# Patient Record
Sex: Female | Born: 1990 | Race: White | Hispanic: No | Marital: Married | Smoking: Never smoker
Health system: Southern US, Community
[De-identification: ages and names within clinical notes are randomized; demographics above are authoritative.]

## PROBLEM LIST (undated history)

## (undated) DIAGNOSIS — F329 Major depressive disorder, single episode, unspecified: Secondary | ICD-10-CM

## (undated) DIAGNOSIS — F419 Anxiety disorder, unspecified: Secondary | ICD-10-CM

## (undated) DIAGNOSIS — F32A Depression, unspecified: Secondary | ICD-10-CM

## (undated) HISTORY — PX: WISDOM TOOTH EXTRACTION: SHX21

## (undated) HISTORY — PX: TONSILLECTOMY: SUR1361

## (undated) HISTORY — DX: Anxiety disorder, unspecified: F41.9

## (undated) HISTORY — DX: Major depressive disorder, single episode, unspecified: F32.9

## (undated) HISTORY — DX: Depression, unspecified: F32.A

## (undated) NOTE — Procedures (Signed)
 Formatting of this note might be different from the original.   Patient: Sapir Lavey [facility] MEDICAL RECORD [id number]   DOB: 1991/08/12  Procedure: High-resolution esophageal manometry with impedance  Date of Procedure: 01/11/2023  Indications:  Dysphagia  Procedure Details  Per nurse's protocol, a topical analgesic was used to numb the nares followed by trans-nasal insertion of the esophageal manometry catheter.  The patient was encouraged to relax while acclimating to the catheter for approximately 5 minutes.  The lower and upper esophageal sphincters were observed using manometric technique.  A series of ten wet swallows using 5 cc room temperature water was performed to assess esophageal motility.  Measurements were obtained of the lower esophageal sphincter, esophageal body, and upper esophageal sphincter.    Findings: Anatomy: The proximal margin of the LES was identified using manometric technique and found to be at 39.5 cm from the nares.  The LES length was 2.7 cm (nl 2.7-4.8 cm).  A hiatal hernia was not seen.  Lower esophageal sphincter:   The mean basal pressure was 11.8 which is abnormal (nl 13-43 mmHg).  The mean residual pressure was 6.1 which is normal  (nl <15 mmHg); this is consistent with adequate relaxation of the LES.    Esophageal body:   10 wet swallows were evaluated.  The mean distal contractile integral (DCI, measure of contractile vigor) was 2067 which is normal  (nl [phone] mmHg-cm-s).  The mean distal latency was 5.4 which is normal (nl >4.5 sec, with <4.5 sec suggesting premature contractions or spasm).    When evaluating the individual swallows, there were no failed or weak swallows; each individual swallow demonstrated contractile vigor within normal range.   There were no swallows with premature contractions or containing large breaks.  No other abnormalities on individual swallows are noted based on current [place] Classification.  Impedance analysis  demonstrates complete bolus clearance in 100% of swallows.    Upper esophageal sphincter:   The mean basal pressure was 131, which is abnormal (nl 34-104 mmHg).  The mean residual pressure was -6.4 which is normal (nl <12.0 mmHg).  Impression:   Slightly low basal pressure of LES of unclear significance.  The UES pressure is abnormally high, which is of unclear clinical significance. Otherwise normal manometery  Recommendations: Follow up with Dr. Christiana Chew K. [place], DO [facility] 01/24/2023, 16:07 Electronically signed by Chew Drivers Matrachisia, DO at 01/24/2023  4:12 PM EST

---
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
## 2010-03-14 ENCOUNTER — Emergency Department (HOSPITAL_COMMUNITY): Admission: EM | Admit: 2010-03-14 | Discharge: 2010-03-15 | Payer: Self-pay | Admitting: Emergency Medicine

## 2015-06-29 ENCOUNTER — Encounter (HOSPITAL_COMMUNITY): Payer: Self-pay | Admitting: Neurology

## 2015-06-29 ENCOUNTER — Emergency Department (HOSPITAL_COMMUNITY): Payer: 59

## 2015-06-29 ENCOUNTER — Emergency Department (HOSPITAL_COMMUNITY)
Admission: EM | Admit: 2015-06-29 | Discharge: 2015-06-29 | Disposition: A | Discharge status: Home / Self Care | Payer: 59 | Attending: Emergency Medicine | Admitting: Emergency Medicine

## 2015-06-29 DIAGNOSIS — F410 Panic disorder [episodic paroxysmal anxiety] without agoraphobia: Secondary | ICD-10-CM | POA: Insufficient documentation

## 2015-06-29 DIAGNOSIS — R202 Paresthesia of skin: Secondary | ICD-10-CM | POA: Diagnosis not present

## 2015-06-29 DIAGNOSIS — R42 Dizziness and giddiness: Secondary | ICD-10-CM

## 2015-06-29 DIAGNOSIS — E876 Hypokalemia: Secondary | ICD-10-CM | POA: Insufficient documentation

## 2015-06-29 DIAGNOSIS — Z3202 Encounter for pregnancy test, result negative: Secondary | ICD-10-CM | POA: Diagnosis not present

## 2015-06-29 DIAGNOSIS — R000 Tachycardia, unspecified: Secondary | ICD-10-CM | POA: Insufficient documentation

## 2015-06-29 DIAGNOSIS — R0789 Other chest pain: Secondary | ICD-10-CM | POA: Diagnosis not present

## 2015-06-29 DIAGNOSIS — R110 Nausea: Secondary | ICD-10-CM

## 2015-06-29 LAB — I-STAT BETA HCG BLOOD, ED (MC, WL, AP ONLY): I-stat hCG, quantitative: <5 m[IU]/mL (ref <5)

## 2015-06-29 LAB — CBC WITH DIFFERENTIAL/PLATELET
Basophils Absolute: 0.1 10*3/uL (ref 0.0–0.1)
Basophils Relative: 1 % (ref 0–1)
EOS PCT: 4 % (ref 0–5)
Eosinophils Absolute: 0.3 10*3/uL (ref 0.0–0.7)
HCT: 40.2 % (ref 36.0–46.0)
HEMOGLOBIN: 13.6 g/dL (ref 12.0–15.0)
Lymphocytes Relative: 47 % — ABNORMAL HIGH (ref 12–46)
Lymphs Abs: 3.2 10*3/uL (ref 0.7–4.0)
MCH: 29.3 pg (ref 26.0–34.0)
MCHC: 33.8 g/dL (ref 30.0–36.0)
MCV: 86.6 fL (ref 78.0–100.0)
Monocytes Absolute: 0.6 10*3/uL (ref 0.1–1.0)
Monocytes Relative: 8 % (ref 3–12)
NEUTROS ABS: 2.7 10*3/uL (ref 1.7–7.7)
NEUTROS PCT: 40 % — AB (ref 43–77)
Platelets: 330 10*3/uL (ref 150–400)
RBC: 4.64 MIL/uL (ref 3.87–5.11)
RDW: 13.7 % (ref 11.5–15.5)
WBC: 6.9 10*3/uL (ref 4.0–10.5)

## 2015-06-29 LAB — COMPREHENSIVE METABOLIC PANEL
ALT: 18 U/L (ref 14–54)
ANION GAP: 12 (ref 5–15)
AST: 27 U/L (ref 15–41)
Albumin: 4.1 g/dL (ref 3.5–5.0)
Alkaline Phosphatase: 38 U/L (ref 38–126)
BUN: 9 mg/dL (ref 6–20)
CO2: 20 mmol/L — AB (ref 22–32)
Calcium: 9.1 mg/dL (ref 8.9–10.3)
Chloride: 102 mmol/L (ref 101–111)
Creatinine, Ser: 0.77 mg/dL (ref 0.44–1.00)
GFR calc Af Amer: >60 mL/min (ref >60)
GFR calc non Af Amer: >60 mL/min (ref >60)
GLUCOSE: 90 mg/dL (ref 65–99)
POTASSIUM: 3.1 mmol/L — AB (ref 3.5–5.1)
Sodium: 134 mmol/L — ABNORMAL LOW (ref 135–145)
Total Bilirubin: 0.9 mg/dL (ref 0.3–1.2)
Total Protein: 7.1 g/dL (ref 6.5–8.1)

## 2015-06-29 LAB — MAGNESIUM: Magnesium: 1.6 mg/dL — ABNORMAL LOW (ref 1.7–2.4)

## 2015-06-29 LAB — I-STAT TROPONIN, ED: Troponin i, poc: 0 ng/mL (ref 0.00–0.08)

## 2015-06-29 LAB — PHOSPHORUS: Phosphorus: 1 mg/dL — CL (ref 2.5–4.6)

## 2015-06-29 LAB — CBG MONITORING, ED: GLUCOSE-CAPILLARY: 85 mg/dL (ref 65–99)

## 2015-06-29 MED ORDER — LORAZEPAM 2 MG/ML IJ SOLN
0.5000 mg | Freq: Once | INTRAMUSCULAR | Status: AC
  Administered 2015-06-29: 0.5 mg via INTRAVENOUS
  Filled 2015-06-29: qty 1

## 2015-06-29 MED ORDER — NITROGLYCERIN 0.4 MG SL SUBL: 0.4000 mg | SUBLINGUAL_TABLET | SUBLINGUAL | Status: DC | PRN

## 2015-06-29 MED ORDER — ASPIRIN 81 MG PO CHEW
324.0000 mg | CHEWABLE_TABLET | Freq: Once | ORAL | Status: AC
Start: 2015-06-29 — End: 2015-06-29
  Administered 2015-06-29: 324 mg via ORAL
  Filled 2015-06-29: qty 4

## 2015-06-29 MED ORDER — POTASSIUM PHOSPHATE MONOBASIC 500 MG PO TABS
500.0000 mg | ORAL_TABLET | Freq: Once | ORAL | Status: DC
  Filled 2015-06-29: qty 1

## 2015-06-29 MED ORDER — POTASSIUM CHLORIDE CRYS ER 20 MEQ PO TBCR: 60.0000 meq | EXTENDED_RELEASE_TABLET | Freq: Once | ORAL | Status: DC

## 2015-06-29 MED ORDER — ONDANSETRON HCL 4 MG/2ML IJ SOLN
4.0000 mg | Freq: Once | INTRAMUSCULAR | Status: AC
  Administered 2015-06-29: 4 mg via INTRAVENOUS
  Filled 2015-06-29: qty 2

## 2015-06-29 MED ORDER — K PHOS MONO-SOD PHOS DI & MONO 155-852-130 MG PO TABS
500.0000 mg | ORAL_TABLET | Freq: Once | ORAL | Status: AC
  Administered 2015-06-29: 500 mg via ORAL
  Filled 2015-06-29: qty 2

## 2015-06-29 MED ORDER — POTASSIUM PHOSPHATE MONOBASIC 500 MG PO TABS: 500.0000 mg | ORAL_TABLET | Freq: Three times a day (TID) | ORAL | Status: DC

## 2015-06-29 MED ORDER — SODIUM CHLORIDE 0.9 % IV BOLUS (SEPSIS)
1000.0000 mL | Freq: Once | INTRAVENOUS | Status: AC
  Administered 2015-06-29: 1000 mL via INTRAVENOUS
  Filled 2015-06-29: qty 1000

## 2015-06-29 NOTE — Discharge Instructions (Signed)
Your symptoms today were likely related to a panic attack, and some dehydration. Your potassium and phosphorus were low, you will need to start taking the supplement prescribed to you to replenish this. Use the list of foods below to find out what foods may help supplement your diet to avoid further issues with low electrolytes. Stay very well hydrated. Avoid stimulant medications and foods, such as coffee, tea, or your diet pill in order to avoid worsening symptoms. Follow up with your regular doctor in 5-7 days for recheck of symptoms and ongoing evaluation. Return to the ER for changes or worsening symptoms.   Chest Pain (Nonspecific) It is often hard to give a diagnosis for the cause of chest pain. There is always a chance that your pain could be related to something serious, such as a heart attack or a blood clot in the lungs. You need to follow up with your doctor. HOME CARE  If antibiotic medicine was given, take it as directed by your doctor. Finish the medicine even if you start to feel better.  For the next few days, avoid activities that bring on chest pain. Continue physical activities as told by your doctor.  Do not use any tobacco products. This includes cigarettes, chewing tobacco, and e-cigarettes.  Avoid drinking alcohol.  Only take medicine as told by your doctor.  Follow your doctor's suggestions for more testing if your chest pain does not go away.  Keep all doctor visits you made. GET HELP IF:  Your chest pain does not go away, even after treatment.  You have a rash with blisters on your chest.  You have a fever. GET HELP RIGHT AWAY IF:   You have more pain or pain that spreads to your arm, neck, jaw, back, or belly (abdomen).  You have shortness of breath.  You cough more than usual or cough up blood.  You have very bad back or belly pain.  You feel sick to your stomach (nauseous) or throw up (vomit).  You have very bad weakness.  You pass out  (faint).  You have chills. This is an emergency. Do not wait to see if the problems will go away. Call your local emergency services (911 in [place] Do not drive yourself to the hospital. MAKE SURE YOU:   Understand these instructions.  Will watch your condition.  Will get help right away if you are not doing well or get worse. Document Released: 05/15/2008 Document Revised: 12/02/2013 Document Reviewed: 05/15/2008 [facility] Patient Information 2015 [place], [place]. This information is not intended to replace advice given to you by your health care provider. Make sure you discuss any questions you have with your health care provider.  Hypokalemia Hypokalemia means that the amount of potassium in the blood is lower than normal.Potassium is a chemical, called an electrolyte, that helps regulate the amount of fluid in the body. It also stimulates muscle contraction and helps nerves function properly.Most of the body's potassium is inside of cells, and only a very small amount is in the blood. Because the amount in the blood is so small, minor changes can be life-threatening. CAUSES  Antibiotics.  Diarrhea or vomiting.  Using laxatives too much, which can cause diarrhea.  Chronic kidney disease.  Water pills (diuretics).  Eating disorders (bulimia).  Low magnesium level.  Sweating a lot. SIGNS AND SYMPTOMS  Weakness.  Constipation.  Fatigue.  Muscle cramps.  Mental confusion.  Skipped heartbeats or irregular heartbeat (palpitations).  Tingling or numbness. DIAGNOSIS  Your  health care provider can diagnose hypokalemia with blood tests. In addition to checking your potassium level, your health care provider may also check other lab tests. TREATMENT Hypokalemia can be treated with potassium supplements taken by mouth or adjustments in your current medicines. If your potassium level is very low, you may need to get potassium through a vein (IV) and be monitored in the  hospital. A diet high in potassium is also helpful. Foods high in potassium are:  Nuts, such as peanuts and pistachios.  Seeds, such as sunflower seeds and pumpkin seeds.  Peas, lentils, and lima beans.  Whole grain and bran cereals and breads.  Fresh fruit and vegetables, such as apricots, avocado, bananas, cantaloupe, kiwi, oranges, tomatoes, asparagus, and potatoes.  Orange and tomato juices.  Red meats.  Fruit yogurt. HOME CARE INSTRUCTIONS  Take all medicines as prescribed by your health care provider.  Maintain a healthy diet by including nutritious food, such as fruits, vegetables, nuts, whole grains, and lean meats.  If you are taking a laxative, be sure to follow the directions on the label. SEEK MEDICAL CARE IF:  Your weakness gets worse.  You feel your heart pounding or racing.  You are vomiting or having diarrhea.  You are diabetic and having trouble keeping your blood glucose in the normal range. SEEK IMMEDIATE MEDICAL CARE IF:  You have chest pain, shortness of breath, or dizziness.  You are vomiting or having diarrhea for more than 2 days.  You faint. MAKE SURE YOU:   Understand these instructions.  Will watch your condition.  Will get help right away if you are not doing well or get worse. Document Released: 11/27/2005 Document Revised: 09/17/2013 Document Reviewed: 05/30/2013 [facility] ([facility] Patient Information 2015 [place], [place]. This information is not intended to replace advice given to you by your health care provider. Make sure you discuss any questions you have with your health care provider.  Hypophosphatemia Hypophosphatemia means you have a lack of phosphates in your blood. Phosphates are important for the strength and structure of bones and teeth, and for muscle functioning. Low blood phosphate levels can cause a variety of symptoms and problems. CAUSES Causes of hypophosphatemia may include:  Alcoholism.  Chronic  diarrhea.  Starvation.  Vitamin D deficiency.  Excessive use of antacids.  Use of steroid medicines.  Hyperparathyroidism.  Intestinal problems that interfere with normal absorption of nutrients.  Diabetic ketoacidosis.  Total parenteral nutrition.  Nasogastric suction.  Severe infections.  Genetic kidney problems, such as autosomal dominant hypophosphatemic rickets.  Oncogenic osteomalacia (a condition of low phosphate levels with certain types of tumors) .  Surgery to remove a part of the liver.  Organ transplantation.  Sudden liver failure.  Metabolic syndrome. SYMPTOMS  Bone pain.  Bowed legs.  Growth problems (short height).  Weak muscles.  Confusion.  Shortness of breath.  Seizures. DIAGNOSIS Hypophosphatemia is usually diagnosed through blood tests.  TREATMENT Treatment for hypophosphatemia includes giving phosphate supplements. These can be given by mouth or through the vein (intravenously), depending on the severity of the symptoms and deficiency. Vitamin D may also be given to treat hypophosphatemia. HOME CARE INSTRUCTIONS Consult with a dietitian as recommended to make sure you are eating the most healthful diet possible. SEEK IMMEDIATE MEDICAL CARE IF:  You develop chest pain.  You have difficulty breathing.  You develop increased weakness.  You have a suspected bone fracture.  You have severe pain.  You develop mood, memory, or personality changes. Document Released: 03/14/2011 Document Revised:  03/24/2013 Document Reviewed: 03/14/2011 [employer] Patient Information 2015 [place], [place]. This information is not intended to replace advice given to you by your health care provider. Make sure you discuss any questions you have with your health care provider.  Panic Attacks Panic attacks are sudden, short-livedsurges of severe anxiety, fear, or discomfort. They may occur for no reason when you are relaxed, when you are anxious, or when  you are sleeping. Panic attacks may occur for a number of reasons:   Healthy people occasionally have panic attacks in extreme, life-threatening situations, such as war or natural disasters. Normal anxiety is a protective mechanism of the body that helps [place] react to danger (fight or flight response).  Panic attacks are often seen with anxiety disorders, such as panic disorder, social anxiety disorder, generalized anxiety disorder, and phobias. Anxiety disorders cause excessive or uncontrollable anxiety. They may interfere with your relationships or other life activities.  Panic attacks are sometimes seen with other mental illnesses, such as depression and posttraumatic stress disorder.  Certain medical conditions, prescription medicines, and drugs of abuse can cause panic attacks. SYMPTOMS  Panic attacks start suddenly, peak within 20 minutes, and are accompanied by four or more of the following symptoms:  Pounding heart or fast heart rate (palpitations).  Sweating.  Trembling or shaking.  Shortness of breath or feeling smothered.  Feeling choked.  Chest pain or discomfort.  Nausea or strange feeling in your stomach.  Dizziness, light-headedness, or feeling like you will faint.  Chills or hot flushes.  Numbness or tingling in your lips or hands and feet.  Feeling that things are not real or feeling that you are not yourself.  Fear of losing control or going crazy.  Fear of dying. Some of these symptoms can mimic serious medical conditions. For example, you may think you are having a heart attack. Although panic attacks can be very scary, they are not life threatening. DIAGNOSIS  Panic attacks are diagnosed through an assessment by your health care provider. Your health care provider will ask questions about your symptoms, such as where and when they occurred. Your health care provider will also ask about your medical history and use of alcohol and drugs, including prescription  medicines. Your health care provider may order blood tests or other studies to rule out a serious medical condition. Your health care provider may refer you to a mental health professional for further evaluation. TREATMENT   Most healthy people who have one or two panic attacks in an extreme, life-threatening situation will not require treatment.  The treatment for panic attacks associated with anxiety disorders or other mental illness typically involves counseling with a mental health professional, medicine, or a combination of both. Your health care provider will help determine what treatment is best for you.  Panic attacks due to physical illness usually go away with treatment of the illness. If prescription medicine is causing panic attacks, talk with your health care provider about stopping the medicine, decreasing the dose, or substituting another medicine.  Panic attacks due to alcohol or drug abuse go away with abstinence. Some adults need professional help in order to stop drinking or using drugs. HOME CARE INSTRUCTIONS   Take all medicines as directed by your health care provider.   Schedule and attend follow-up visits as directed by your health care provider. It is important to keep all your appointments. SEEK MEDICAL CARE IF:  You are not able to take your medicines as prescribed.  Your symptoms do not improve  or get worse. SEEK IMMEDIATE MEDICAL CARE IF:   You experience panic attack symptoms that are different than your usual symptoms.  You have serious thoughts about hurting yourself or others.  You are taking medicine for panic attacks and have a serious side effect. MAKE SURE YOU:  Understand these instructions.  Will watch your condition.  Will get help right away if you are not doing well or get worse. Document Released: 11/27/2005 Document Revised: 12/02/2013 Document Reviewed: 07/11/2013 [facility] Patient Information 2015 [place], [place]. This information is  not intended to replace advice given to you by your health care provider. Make sure you discuss any questions you have with your health care provider.  Orthostatic Hypotension Orthostatic hypotension is a sudden drop in blood pressure. It happens when you quickly stand up from a seated or lying position. You may feel dizzy or light-headed. This can last for just a few seconds or for up to a few minutes. It is usually not a serious problem. However, if this happens frequently or gets worse, it can be a sign of something more serious. CAUSES  Different things can cause orthostatic hypotension, including:   Loss of body fluids (dehydration).  Medicines that lower blood pressure.  Sudden changes in posture, such as standing up quickly after you have been sitting or lying down.  Taking too much of your medicine. SIGNS AND SYMPTOMS   Light-headedness or dizziness.   Fainting or near-fainting.   A fast heart rate.   Weakness.   Feeling tired (fatigue).  DIAGNOSIS  Your health care provider may do several things to help diagnose your condition and identify the cause. These may include:   Taking a medical history and doing a physical exam.  Checking your blood pressure. Your health care provider will check your blood pressure when you are:  Lying down.  Sitting.  Standing.  Using tilt table testing. In this test, you lie down on a table that moves from a lying position to a standing position. You will be strapped onto the table. This test monitors your blood pressure and heart rate when you are in different positions. TREATMENT  Treatment will vary depending on the cause. Possible treatments include:   Changing the dosage of your medicines.  Wearing compression stockings on your lower legs.  Standing up slowly after sitting or lying down.  Eating more salt.  Eating frequent, small meals.  In some cases, getting IV fluids.  Taking medicine to enhance fluid  retention. HOME CARE INSTRUCTIONS  Only take over-the-counter or prescription medicines as directed by your health care provider.  Follow your health care provider's instructions for changing the dosage of your current medicines.  Do not stop or adjust your medicine on your own.  Stand up slowly after sitting or lying down. This allows your body to adjust to the different position.  Wear compression stockings as directed.  Eat extra salt as directed.  Do not add extra salt to your diet unless directed to by your health care provider.  Eat frequent, small meals.  Avoid standing suddenly after eating.  Avoid hot showers or excessive heat as directed by your health care provider.  Keep all follow-up appointments. SEEK MEDICAL CARE IF:  You continue to feel dizzy or light-headed after standing.  You feel groggy or confused.  You feel cold, clammy, or sick to your stomach (nauseous).  You have blurred vision.  You feel short of breath. SEEK IMMEDIATE MEDICAL CARE IF:   You faint after  standing.  You have chest pain.  You have difficulty breathing.   You lose feeling or movement in your arms or legs.   You have slurred speech or difficulty talking, or you are unable to talk.  MAKE SURE YOU:   Understand these instructions.  Will watch your condition.  Will get help right away if you are not doing well or get worse. Document Released: 11/17/2002 Document Revised: 12/02/2013 Document Reviewed: 09/19/2013 [facility] Patient Information 2015 [place], [place]. This information is not intended to replace advice given to you by your health care provider. Make sure you discuss any questions you have with your health care provider.  Potassium Content of Foods Potassium is a mineral found in many foods and drinks. It helps keep fluids and minerals balanced in your body and affects how steadily your heart beats. Potassium also helps control your blood pressure and keep your  muscles and nervous system healthy. Certain health conditions and medicines may change the balance of potassium in your body. When this happens, you can help balance your level of potassium through the foods that you do or do not eat. Your health care provider or dietitian may recommend an amount of potassium that you should have each day. The following lists of foods provide the amount of potassium (in parentheses) per serving in each item. HIGH IN POTASSIUM  The following foods and beverages have 200 mg or more of potassium per serving:  Apricots, 2 raw or 5 dry (200 mg).  Artichoke, 1 medium (345 mg).  Avocado, raw,  each (245 mg).  Banana, 1 medium (425 mg).  Beans, lima, or baked beans, canned,  cup (280 mg).  Beans, white, canned,  cup (595 mg).  Beef roast, 3 oz (320 mg).  Beef, ground, 3 oz (270 mg).  Beets, raw or cooked,  cup (260 mg).  Bran muffin, 2 oz (300 mg).  Broccoli,  cup (230 mg).  Brussels sprouts,  cup (250 mg).  Cantaloupe,  cup (215 mg).  Cereal, 100% bran,  cup (200-400 mg).  Cheeseburger, single, fast food, 1 each (225-400 mg).  Chicken, 3 oz (220 mg).  Clams, canned, 3 oz (535 mg).  Crab, 3 oz (225 mg).  Dates, 5 each (270 mg).  Dried beans and peas,  cup (300-475 mg).  Figs, dried, 2 each (260 mg).  Fish: halibut, tuna, cod, snapper, 3 oz (480 mg).  Fish: salmon, haddock, swordfish, perch, 3 oz (300 mg).  Fish, tuna, canned 3 oz (200 mg).  [place] fries, fast food, 3 oz (470 mg).  Granola with fruit and nuts,  cup (200 mg).  Grapefruit juice,  cup (200 mg).  Greens, beet,  cup (655 mg).  Honeydew melon,  cup (200 mg).  Kale, raw, 1 cup (300 mg).  Kiwi, 1 medium (240 mg).  Kohlrabi, rutabaga, parsnips,  cup (280 mg).  Lentils,  cup (365 mg).  Mango, 1 each (325 mg).  Milk, chocolate, 1 cup (420 mg).  Milk: nonfat, low-fat, whole, buttermilk, 1 cup (350-380 mg).  Molasses, 1 Tbsp (295  mg).  Mushrooms,  cup (280) mg.  Nectarine, 1 each (275 mg).  Nuts: almonds, peanuts, hazelnuts, [place], cashew, mixed, 1 oz (200 mg).  Nuts, pistachios, 1 oz (295 mg).  Orange, 1 each (240 mg).  Orange juice,  cup (235 mg).  Papaya, medium,  fruit (390 mg).  Peanut butter, chunky, 2 Tbsp (240 mg).  Peanut butter, smooth, 2 Tbsp (210 mg).  Pear, 1 medium (200 mg).  Pomegranate, 1 whole (400 mg).  Pomegranate juice,  cup (215 mg).  Pork, 3 oz (350 mg).  Potato chips, salted, 1 oz (465 mg).  Potato, baked with skin, 1 medium (925 mg).  Potatoes, boiled,  cup (255 mg).  Potatoes, mashed,  cup (330 mg).  Prune juice,  cup (370 mg).  Prunes, 5 each (305 mg).  Pudding, chocolate,  cup (230 mg).  Pumpkin, canned,  cup (250 mg).  Raisins, seedless,  cup (270 mg).  Seeds, sunflower or pumpkin, 1 oz (240 mg).  Soy milk, 1 cup (300 mg).  Spinach,  cup (420 mg).  Spinach, canned,  cup (370 mg).  Sweet potato, baked with skin, 1 medium (450 mg).  Swiss chard,  cup (480 mg).  Tomato or vegetable juice,  cup (275 mg).  Tomato sauce or puree,  cup (400-550 mg).  Tomato, raw, 1 medium (290 mg).  Tomatoes, canned,  cup (200-300 mg).  [place], 3 oz (250 mg).  Wheat germ, 1 oz (250 mg).  Winter squash,  cup (250 mg).  Yogurt, plain or fruited, 6 oz (260-435 mg).  Zucchini,  cup (220 mg). MODERATE IN POTASSIUM The following foods and beverages have 50-200 mg of potassium per serving:  Apple, 1 each (150 mg).  Apple juice,  cup (150 mg).  Applesauce,  cup (90 mg).  Apricot nectar,  cup (140 mg).  Asparagus, small spears,  cup or 6 spears (155 mg).  Bagel, cinnamon raisin, 1 each (130 mg).  Bagel, egg or plain, 4 in., 1 each (70 mg).  Beans, green,  cup (90 mg).  Beans, yellow,  cup (190 mg).  Beer, regular, 12 oz (100 mg).  Beets, canned,  cup (125 mg).  Blackberries,  cup (115 mg).  Blueberries,  cup (60  mg).  Bread, whole wheat, 1 slice (70 mg).  Broccoli, raw,  cup (145 mg).  Cabbage,  cup (150 mg).  Carrots, cooked or raw,  cup (180 mg).  Cauliflower, raw,  cup (150 mg).  Celery, raw,  cup (155 mg).  Cereal, bran flakes, cup (120-150 mg).  Cheese, cottage,  cup (110 mg).  Cherries, 10 each (150 mg).  Chocolate, 1 oz bar (165 mg).  Coffee, brewed 6 oz (90 mg).  Corn,  cup or 1 ear (195 mg).  Cucumbers,  cup (80 mg).  Egg, large, 1 each (60 mg).  Eggplant,  cup (60 mg).  Endive, raw, cup (80 mg).  English muffin, 1 each (65 mg).  Fish, orange roughy, 3 oz (150 mg).  Frankfurter, beef or pork, 1 each (75 mg).  Fruit cocktail,  cup (115 mg).  Grape juice,  cup (170 mg).  Grapefruit,  fruit (175 mg).  Grapes,  cup (155 mg).  Greens: kale, turnip, collard,  cup (110-150 mg).  Ice cream or frozen yogurt, chocolate,  cup (175 mg).  Ice cream or frozen yogurt, vanilla,  cup (120-150 mg).  Lemons, limes, 1 each (80 mg).  Lettuce, all types, 1 cup (100 mg).  Mixed vegetables,  cup (150 mg).  Mushrooms, raw,  cup (110 mg).  Nuts: walnuts, pecans, or macadamia, 1 oz (125 mg).  Oatmeal,  cup (80 mg).  Okra,  cup (110 mg).  Onions, raw,  cup (120 mg).  Peach, 1 each (185 mg).  Peaches, canned,  cup (120 mg).  Pears, canned,  cup (120 mg).  Peas, green, frozen,  cup (90 mg).  Peppers, green,  cup (130 mg).  Peppers, red,  cup (160  mg).  Pineapple juice,  cup (165 mg).  Pineapple, fresh or canned,  cup (100 mg).  Plums, 1 each (105 mg).  Pudding, vanilla,  cup (150 mg).  Raspberries,  cup (90 mg).  Rhubarb,  cup (115 mg).  Rice, wild,  cup (80 mg).  Shrimp, 3 oz (155 mg).  Spinach, raw, 1 cup (170 mg).  Strawberries,  cup (125 mg).  Summer squash  cup (175-200 mg).  Swiss chard, raw, 1 cup (135 mg).  Tangerines, 1 each (140 mg).  Tea, brewed, 6 oz (65 mg).  Turnips,  cup (140  mg).  Watermelon,  cup (85 mg).  Wine, red, table, 5 oz (180 mg).  Wine, white, table, 5 oz (100 mg). LOW IN POTASSIUM The following foods and beverages have less than 50 mg of potassium per serving.  Bread, white, 1 slice (30 mg).  Carbonated beverages, 12 oz (less than 5 mg).  Cheese, 1 oz (20-30 mg).  Cranberries,  cup (45 mg).  Cranberry juice cocktail,  cup (20 mg).  Fats and oils, 1 Tbsp (less than 5 mg).  Hummus, 1 Tbsp (32 mg).  Nectar: papaya, mango, or pear,  cup (35 mg).  Rice, white or brown,  cup (50 mg).  Spaghetti or macaroni,  cup cooked (30 mg).  Tortilla, flour or corn, 1 each (50 mg).  Waffle, 4 in., 1 each (50 mg).  Water chestnuts,  cup (40 mg). Document Released: 07/11/2005 Document Revised: 12/02/2013 Document Reviewed: 10/24/2013 [facility] Patient Information 2015 [place]. This information is not intended to replace advice given to you by your health care provider. Make sure you discuss any questions you have with your health care provider.

## 2015-06-29 NOTE — ED Provider Notes (Signed)
CSN: [id number]     Arrival date & time 06/29/15  1137 History   First MD Initiated Contact with Patient 06/29/15 1151     Chief Complaint  Patient presents with  . Chest Pain  . Panic Attack     (Consider location/radiation/quality/duration/timing/severity/associated sxs/prior Treatment) HPI Comments: Melissa Stanley is a 24 y.o. female with a PMHx of anxiety/panic attacks, who presents to the ED with complaints of lightheadedness, palpitations, anxiety/panic attack, nausea, and chest tightness/tingling that began this morning. 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 At that time she felt 7/10 tingling/tightness discomfort in her anterior chest which was constant and radiating to both arms with no aggravating factors, and relieved with slow deep breathing. She states this time that this symptom is nearly resolved upon arrival due to her slow deep breathing. She states that while she was at work she checked her CBG which was 66, she has no history of hypoglycemia. She admits that she did not eat much this morning, and that she just started phentermine for weight loss 1 month ago. She states that overall this feels very similar to her prior panic attacks, but she got scared when her hands became contracted and started tingling. She denies any jaw neck or back pain, fevers, chills, diaphoresis, syncope, headache, vision changes, shortness of breath, cough, leg swelling, recent travel/surgery/immobilization, history knees, history of cancer, history of DVT, claudication, orthopnea, abdominal pain, vomiting, diarrhea, constipation, melena,  hematochezia, dysuria, hematuria, vaginal bleeding or discharge, or focal weakness. She is a nonsmoker. Positive family history of MI, but she is unsure exactly who has had MI in her family. She has no other medical history aside from anxiety, denies history of diabetes, hyperlipidemia, cardiac disease, or hypertension. LMP 1wk ago.  Patient is a 24 y.o. female presenting with chest pain. The history is provided by the patient. No language interpreter was used.  Chest Pain Pain location:  Substernal area Pain quality: tightness   Pain quality comment:  Tingling/warm Pain radiates to:  R arm and L arm Pain radiates to the back: no   Pain severity:  Moderate Onset quality:  Sudden Duration:  10 minutes Timing:  Constant Progression:  Partially resolved Chronicity:  New Context comment:  Walking to car in heat Relieved by: deep breathing. Worsened by:  Nothing tried Ineffective treatments:  None tried Associated symptoms: anxiety, nausea and palpitations   Associated symptoms: no abdominal pain, no back pain, no claudication, no cough, no diaphoresis, no fever, no headache, no lower extremity edema, no numbness, no orthopnea, no shortness of breath, no syncope, not vomiting and no weakness   Risk factors: no birth control, no coronary artery disease, no diabetes mellitus, no high cholesterol, no hypertension, no immobilization, no prior DVT/PE, no smoking and no surgery     History reviewed. No pertinent past medical history. History reviewed. No pertinent past surgical history. No family history on file. History  Substance Use Topics  . Smoking status: Never Smoker   . Smokeless tobacco: Not on file  . Alcohol Use: Yes   OB History    No data available  Review of Systems  Constitutional: Negative for fever, chills and diaphoresis.  Eyes: Negative for visual disturbance.  Respiratory: Positive for chest tightness. Negative for cough and shortness of breath.   Cardiovascular:  Positive for chest pain (tingling/tightness) and palpitations. Negative for orthopnea, claudication, leg swelling and syncope.  Gastrointestinal: Positive for nausea. Negative for vomiting, abdominal pain, diarrhea, constipation and blood in stool.  Genitourinary: Negative for dysuria, hematuria, vaginal bleeding and vaginal discharge.  Musculoskeletal: Negative for myalgias, back pain, arthralgias and neck pain.       +hands/feet in contracture  Skin: Negative for color change.  Allergic/Immunologic: Negative for immunocompromised state.  Neurological: Positive for light-headedness. Negative for syncope, weakness, numbness and headaches.       +tingling in hands/feet  Psychiatric/Behavioral: Negative for confusion. The patient is nervous/anxious.    10 Systems reviewed and are negative for acute change except as noted in the HPI.    Allergies  Review of patient's allergies indicates no known allergies.  Home Medications   Prior to Admission medications   Not on File   BP 148/96 mmHg  Pulse 150  Temp(Src) 98.6 F (37 C) (Oral)  Resp 28  LMP 06/22/2015 Physical Exam  Constitutional: She is oriented to person, place, and time. She appears well-developed and well-nourished.  Non-toxic appearance. She appears distressed.  Afebrile, nontoxic, very anxious, tearful, taking slow deep breaths. Mildly tachycardic upon exam, which resolved during exam, HR 89 during exam.  HENT:  Head: Normocephalic and atraumatic.  Mouth/Throat: Oropharynx is clear and moist. Mucous membranes are dry.  Dry mucous membranes  Eyes: Conjunctivae and EOM are normal. Right eye exhibits no discharge. Left eye exhibits no discharge.  Neck: Normal range of motion. Neck supple. No JVD present.  No JVD  Cardiovascular: Regular rhythm, normal heart sounds and intact distal pulses.  Tachycardia present.  Exam reveals no gallop and no friction rub.   No murmur heard. Tachycardic in the 120s initially, which improved  during exam down to 89 after slow deep breaths, reg rhythm, nl s1/s2, no m/r/g, distal pulses equal and intact bilaterally, no pedal edema  Pulmonary/Chest: Effort normal and breath sounds normal. No respiratory distress. She has no decreased breath sounds. She has no wheezes. She has no rhonchi. She has no rales. She exhibits no tenderness, no deformity and no retraction.  Initially hyperventilating, but after slow deep breaths pt with regular respiratory rate. CTAB in all lung fields, no w/r/r, no hypoxia or increased WOB, speaking in full sentences, SpO2 100% on RA . No chest wall tenderness, retraction, or deformity  Abdominal: Soft. Normal appearance and bowel sounds are normal. She exhibits no distension. There is no tenderness. There is no rigidity, no rebound, no guarding, no CVA tenderness, no tenderness at McBurney's point and negative Murphy's sign.  Musculoskeletal: Normal range of motion.  MAE x4 Strength and sensation grossly intact Distal pulses intact No pedal edema, neg homan's bilaterally Initially hands in contracted position with all fingers inward towards palm, but this resolved after deep breathing.  Neurological: She is alert and oriented to person, place, and time. She has normal strength. No sensory deficit. GCS eye subscore is 4. GCS verbal subscore is 5. GCS motor subscore is 6.  Skin: Skin is warm, dry and intact. No rash noted.  Psychiatric: Her mood appears anxious.  Anxious, tearful  Nursing note and vitals reviewed.   ED Course  Procedures (including critical care time) 12:32 Orthostatic Vital Signs KH  Orthostatic Lying  - BP-  Lying: 141/89 mmHg ; Pulse- Lying: 92  Orthostatic Sitting - BP- Sitting: 142/93 mmHg ; Pulse- Sitting: 86  Orthostatic Standing at 0 minutes - BP- Standing at 0 minutes: 126/88 mmHg ; Pulse- Standing at 0 minutes: 116      Labs Review Labs Reviewed  COMPREHENSIVE METABOLIC PANEL - Abnormal; Notable for the following:    Sodium 134  (*)    Potassium 3.1 (*)    CO2 20 (*)    All other components within normal limits  MAGNESIUM - Abnormal; Notable for the following:    Magnesium 1.6 (*)    All other components within normal limits  PHOSPHORUS - Abnormal; Notable for the following:    Phosphorus 1.0 (*)    All other components within normal limits  CBC WITH DIFFERENTIAL/PLATELET - Abnormal; Notable for the following:    Neutrophils Relative % 40 (*)    Lymphocytes Relative 47 (*)    All other components within normal limits  I-STAT TROPOININ, ED  I-STAT BETA HCG BLOOD, ED ([facility], [facility], [facility] ONLY)    Imaging Review Dg Chest 2 View  06/29/2015   CLINICAL DATA:  Sudden onset chest heaviness. Tingling and contractions in hands.  EXAM: CHEST  2 VIEW  COMPARISON:  None.  FINDINGS: The heart size and mediastinal contours are within normal limits. Both lungs are clear. The visualized skeletal structures are unremarkable.  IMPRESSION: No active cardiopulmonary disease.   Electronically Signed   By: Charlett Nose M.D.   On: 06/29/2015 13:05     EKG Interpretation   Date/Time:  Tuesday June 29 2015 11:42:52 EDT Ventricular Rate:  153 PR Interval:  130 QRS Duration: 74 QT Interval:  328 QTC Calculation: 523 R Axis:   84 Text Interpretation:  Sinus tachycardia Otherwise normal ECG No old  tracing to compare Confirmed by CAMPOS  MD, Caryn Bee ([id number]) on 06/29/2015  12:42:24 PM      MDM   Final diagnoses:  Atypical chest pain  Orthostatic lightheadedness  Nausea  Panic attack  Tachycardia  Hypokalemia  Hypophosphatemia    24 y.o. female here with lightheadedness, nausea, palpitations, anxious feeling, and hands/feet went into contracture and felt tingly. Also developed chest tightness/tingling sensation. States symptoms gradually began this morning, pt didn't feel well, was walking out to car and then developed the chest symptom and hand/feet contracture. Stated she felt like she was having a panic attack. States she checked  her CBG at work and it was 42, no prior hx of hypoglycemia. Recently started phentermine 1 month ago, did not eat much this morning. Upon arrival, pt tachycardic in the 150s, after deep breathing and relaxation, HR down to 89 during exam and symptoms improving. Will give ASA, small dose of ativan, zofran, fluids, and NTG if symptoms persisting. Will get labs, EKG, CXR, and orthostatic VS. Overall symptoms likely from anxiety/panic attack combined with starting stimulant medication and possible dehydration/poor PO intake this morning. No hypoxia, no SOB, no LE swelling, doubt PE. Doubt dissection, pulses strong bilaterally and hands/feet returning to baseline after slow deep breaths. Will monitor and reassess shortly.   1:45 PM Pt feeling greatly improved, tingling resolved in chest/arms, feels tired but no longer having lightheadedness or hand/foot contracture. Orthostatics+, therefore dehydration could be source of her symptoms. Trop neg, betaHCG neg, CMP showing K 3.1, Phos 1.0. Will replete with kphos now and for the next 4 days. CBC w/diff unremarkable. CXR clear. EKG with sinus tachy. Pt with resolution of tachycardia. Feels improved. Tolerating  PO here. Discussed f/up with PCP in 5-7 days for recheck of electrolytes. Discussed avoidance of stimulant meds and foods, stop taking phentermine until she follows up with her PCP. Discussed importance of good hydration. Doubt need for ongoing cardiac testing, likely all symptoms were related to dehydration, heat exposure, and electrolyte imbalance. I explained the diagnosis and have given explicit precautions to return to the ER including for any other new or worsening symptoms. The patient understands and accepts the medical plan as it's been dictated and I have answered their questions. Discharge instructions concerning home care and prescriptions have been given. The patient is STABLE and is discharged to home in good condition.   BP 126/92 mmHg  Pulse 89   Temp(Src) 98.6 F (37 C) (Oral)  Resp 13  SpO2 100%  LMP 06/22/2015  Meds ordered this encounter  Medications  . aspirin chewable tablet 324 mg    Sig:   . LORazepam (ATIVAN) injection 0.5 mg    Sig:   . ondansetron (ZOFRAN) injection 4 mg    Sig:   . sodium chloride 0.9 % bolus 1,000 mL    Sig:   . potassium phosphate (monobasic) (K-PHOS ORIGINAL) tablet 500 mg    Sig:   . potassium phosphate, monobasic, (K-PHOS) 500 MG tablet    Sig: Take 1 tablet (500 mg total) by mouth 4 (four) times daily -  with meals and at bedtime. X 4 days    Dispense:  16 tablet    Refill:  0    Order Specific Question:  Supervising Provider    Answer:  Eber Hong [[id number]]     Tokiko Diefenderfer Camprubi-Soms, PA-C 06/29/15 1402  Azalia Bilis, MD 06/29/15 1406

## 2015-06-29 NOTE — ED Notes (Signed)
Pt reports was working today and left work to walk to her car, felt dizzy and light headed. Pt became anxious, called her husband who told her not to move and he would come get her because her heart was fluttering. Marland Kitchen. Pt then drove her car here because she knew she couldn't walk in the heat. When pt arrived her, her hands her tense and she said she couldn't move them. Pt is hyperventilating and very anxious. HR 150.

## 2015-08-11 ENCOUNTER — Encounter: Payer: Self-pay | Admitting: Podiatry

## 2015-08-11 ENCOUNTER — Ambulatory Visit (INDEPENDENT_AMBULATORY_CARE_PROVIDER_SITE_OTHER): Payer: 59 | Admitting: Podiatry

## 2015-08-11 VITALS — BP 126/70 | HR 73 | Resp 16 | Ht 66.0 in | Wt 176.0 lb

## 2015-08-11 DIAGNOSIS — B070 Plantar wart: Secondary | ICD-10-CM

## 2015-08-11 NOTE — Progress Notes (Signed)
Subjective:     Patient ID: Melissa Stanley, female   DOB: 07-Jul-1991, 24 y.o.   MRN: [id number]  HPI patient presents with painful third toe right stating she's had a lesion for several months and that it's become increasingly tender   Review of Systems  All other systems reviewed and are negative.      Objective:   Physical Exam  Constitutional: She is oriented to person, place, and time.  Cardiovascular: Intact distal pulses.   Musculoskeletal: Normal range of motion.  Neurological: She is oriented to person, place, and time.  Skin: Skin is warm.  Nursing note and vitals reviewed.  neurovascular status intact muscle strength adequate with range of motion of the subtalar and midtarsal joint within normal limits. Patient is noted to have a keratotic lesion distal aspect third toe right that shows pinpoint bleeding upon debridement and small black spots with pain to lateral pressure. Patient's also noted to have a keratotic lesion on the right side of the big toe     Assessment:      verruca plantaris third digit right along with keratotic lesion secondary to friction right big toe    Plan:      H&P and  Condition reviewed with patient. At this time I did a immune response and medication around the distal third digit and wart and applied sterile dressing. Patient will be seen back in 1 month and I instructed what to do with blistering were to occur

## 2015-08-11 NOTE — Progress Notes (Signed)
   Subjective:    Patient ID: Melissa Stanley, female    DOB: 1991/11/08, 24 y.o.   MRN: [id number]  HPI Patient presets with callouses on their right foot; 3rd toe (x1 month) and great toe (x3-4 years); medial side. Soaked in epsom salt with some relief.   Review of Systems  All other systems reviewed and are negative.      Objective:   Physical Exam        Assessment & Plan:

## 2015-09-08 ENCOUNTER — Ambulatory Visit: Payer: 59 | Admitting: Podiatry

## 2015-09-15 ENCOUNTER — Emergency Department (HOSPITAL_COMMUNITY)
Admission: EM | Admit: 2015-09-15 | Discharge: 2015-09-15 | Disposition: A | Discharge status: Home / Self Care | Payer: 59 | Attending: Emergency Medicine | Admitting: Emergency Medicine

## 2015-09-15 ENCOUNTER — Encounter (HOSPITAL_COMMUNITY): Payer: Self-pay | Admitting: Cardiology

## 2015-09-15 DIAGNOSIS — R002 Palpitations: Secondary | ICD-10-CM | POA: Diagnosis not present

## 2015-09-15 DIAGNOSIS — F410 Panic disorder [episodic paroxysmal anxiety] without agoraphobia: Secondary | ICD-10-CM | POA: Insufficient documentation

## 2015-09-15 DIAGNOSIS — R51 Headache: Secondary | ICD-10-CM | POA: Insufficient documentation

## 2015-09-15 DIAGNOSIS — F419 Anxiety disorder, unspecified: Secondary | ICD-10-CM | POA: Diagnosis present

## 2015-09-15 LAB — I-STAT CHEM 8, ED
BUN: 10 mg/dL (ref 6–20)
CREATININE: 0.7 mg/dL (ref 0.44–1.00)
Calcium, Ion: 1.13 mmol/L (ref 1.12–1.23)
Chloride: 105 mmol/L (ref 101–111)
GLUCOSE: 91 mg/dL (ref 65–99)
HEMATOCRIT: 48 % — AB (ref 36.0–46.0)
Hemoglobin: 16.3 g/dL — ABNORMAL HIGH (ref 12.0–15.0)
POTASSIUM: 3.8 mmol/L (ref 3.5–5.1)
Sodium: 142 mmol/L (ref 135–145)
TCO2: 22 mmol/L (ref 0–100)

## 2015-09-15 MED ORDER — LORAZEPAM 0.5 MG PO TABS
0.5000 mg | ORAL_TABLET | Freq: Once | ORAL | Status: AC
  Administered 2015-09-15: 0.5 mg via ORAL
  Filled 2015-09-15: qty 1

## 2015-09-15 MED ORDER — LORAZEPAM 0.5 MG PO TABS
0.5000 mg | ORAL_TABLET | Freq: Once | ORAL | Status: AC
Start: 2015-09-15 — End: 2015-09-15
  Administered 2015-09-15: 0.5 mg via ORAL
  Filled 2015-09-15: qty 1

## 2015-09-15 MED ORDER — LORAZEPAM 1 MG PO TABS: 1.0000 mg | ORAL_TABLET | Freq: Three times a day (TID) | ORAL | Status: DC | PRN

## 2015-09-15 NOTE — ED Provider Notes (Signed)
CSN: [id number]     Arrival date & time 09/15/15  1554 History   First MD Initiated Contact with Patient 09/15/15 1625     Chief Complaint  Patient presents with  . Panic Attack     (Consider location/radiation/quality/duration/timing/severity/associated sxs/prior Treatment) The history is provided by the patient.  patient presents as a likely panic attack. Has had the same in the past. States she was driving and began to have a headache. She began to breathe quickly to feel her heart race. She rates her hands tingle. She had a similar episode in July that was diagnosed a panic attack. States she has a lot going on in her life. She is in school and works and has children. She denies substance abuse. She denies possibility of pregnancy. History reviewed. No pertinent past medical history. History reviewed. No pertinent past surgical history. History reviewed. No pertinent family history. Social History  Substance Use Topics  . Smoking status: Never Smoker   . Smokeless tobacco: None  . Alcohol Use: Yes   OB History    No data available     Review of Systems  Constitutional: Negative for activity change and appetite change.  Eyes: Negative for pain.  Respiratory: Negative for chest tightness and shortness of breath.   Cardiovascular: Positive for palpitations. Negative for chest pain and leg swelling.  Gastrointestinal: Negative for nausea and abdominal pain.  Genitourinary: Negative for flank pain.  Musculoskeletal: Negative for back pain and neck stiffness.  Neurological: Positive for headaches. Negative for weakness and numbness.  Psychiatric/Behavioral: Negative for behavioral problems and self-injury. The patient is nervous/anxious.       Allergies  Review of patient's allergies indicates no known allergies.  Home Medications   Prior to Admission medications   Medication Sig Start Date End Date Taking? Authorizing Provider  aspirin-acetaminophen-caffeine (EXCEDRIN  MIGRAINE) [phone] MG tablet Take 2 tablets by mouth every 6 (six) hours as needed for headache.   Yes Historical Provider, MD  LORazepam (ATIVAN) 1 MG tablet Take 1 tablet (1 mg total) by mouth 3 (three) times daily as needed for anxiety. 09/15/15   Benjiman Core, MD   BP 111/66 mmHg  Pulse 73  Temp(Src) 98.1 F (36.7 C) (Oral)  Resp 13  Ht  (1.702 m)  Wt 172 lb (78.019 kg)  BMI 26.93 kg/m2  SpO2 100% Physical Exam  Constitutional: She appears well-developed.  HENT:  Head: Normocephalic.  Eyes: Pupils are equal, round, and reactive to light.  Cardiovascular: Normal rate.   Pulmonary/Chest: Effort normal.  Neurological: She is alert.  Psychiatric:  Patient appears somewhat anxious and slightly tearful.    ED Course  Procedures (including critical care time) Labs Review Labs Reviewed  I-STAT CHEM 8, ED - Abnormal; Notable for the following:    Hemoglobin 16.3 (*)    HCT 48.0 (*)    All other components within normal limits    Imaging Review No results found. I have personally reviewed and evaluated these images and lab results as part of my medical decision-making.   EKG Interpretation   Date/Time:  Wednesday September 15 2015 16:08:10 EDT Ventricular Rate:  80 PR Interval:  133 QRS Duration: 88 QT Interval:  393 QTC Calculation: 453 R Axis:   81 Text Interpretation:  Sinus rhythm Confirmed by Rubin Payor  MD, Harrold Donath  [phone]) on 09/15/2015 5:08:59 PM      MDM   Final diagnoses:  Panic attack     patient is likely panic attack. History of  same. Previous  Hypokalemia is resolved. Patient feels somewhat better on be discharged home.    Benjiman Core, MD 09/15/15 [phone]

## 2015-09-15 NOTE — ED Notes (Signed)
EMS picked pt up at school , having a panic attack.  Heart rate initially 140.

## 2015-09-15 NOTE — Discharge Instructions (Signed)

## 2015-09-15 NOTE — ED Notes (Signed)
Pt expressed understanding of discharge instructions,  

## 2016-01-27 DIAGNOSIS — N764 Abscess of vulva: Secondary | ICD-10-CM | POA: Diagnosis not present

## 2016-01-28 DIAGNOSIS — N764 Abscess of vulva: Secondary | ICD-10-CM | POA: Diagnosis not present

## 2016-01-31 DIAGNOSIS — L02215 Cutaneous abscess of perineum: Secondary | ICD-10-CM | POA: Diagnosis not present

## 2016-02-03 DIAGNOSIS — N764 Abscess of vulva: Secondary | ICD-10-CM | POA: Diagnosis not present

## 2016-02-18 DIAGNOSIS — Z1389 Encounter for screening for other disorder: Secondary | ICD-10-CM | POA: Diagnosis not present

## 2016-02-18 DIAGNOSIS — Z6829 Body mass index (BMI) 29.0-29.9, adult: Secondary | ICD-10-CM | POA: Diagnosis not present

## 2016-02-18 DIAGNOSIS — R0600 Dyspnea, unspecified: Secondary | ICD-10-CM | POA: Diagnosis not present

## 2016-02-18 DIAGNOSIS — R0789 Other chest pain: Secondary | ICD-10-CM | POA: Diagnosis not present

## 2016-02-18 DIAGNOSIS — F410 Panic disorder [episodic paroxysmal anxiety] without agoraphobia: Secondary | ICD-10-CM | POA: Diagnosis not present

## 2016-02-21 ENCOUNTER — Other Ambulatory Visit (HOSPITAL_COMMUNITY): Payer: Self-pay

## 2016-02-21 ENCOUNTER — Ambulatory Visit (HOSPITAL_COMMUNITY)
Admission: RE | Admit: 2016-02-21 | Discharge: 2016-02-21 | Disposition: A | Discharge status: Home / Self Care | Payer: 59 | Source: Ambulatory Visit | Attending: Physician Assistant | Admitting: Physician Assistant

## 2016-02-21 DIAGNOSIS — R079 Chest pain, unspecified: Secondary | ICD-10-CM | POA: Diagnosis not present

## 2016-02-21 DIAGNOSIS — R791 Abnormal coagulation profile: Secondary | ICD-10-CM | POA: Diagnosis not present

## 2016-02-21 DIAGNOSIS — R000 Tachycardia, unspecified: Secondary | ICD-10-CM | POA: Diagnosis not present

## 2016-02-21 DIAGNOSIS — R918 Other nonspecific abnormal finding of lung field: Secondary | ICD-10-CM | POA: Diagnosis not present

## 2016-02-21 DIAGNOSIS — R0602 Shortness of breath: Secondary | ICD-10-CM | POA: Insufficient documentation

## 2016-02-21 MED ORDER — IOHEXOL 350 MG/ML SOLN
100.0000 mL | Freq: Once | INTRAVENOUS | Status: AC | PRN
  Administered 2016-02-21: 100 mL via INTRAVENOUS

## 2016-03-02 DIAGNOSIS — F411 Generalized anxiety disorder: Secondary | ICD-10-CM | POA: Diagnosis not present

## 2016-03-16 ENCOUNTER — Institutional Professional Consult (permissible substitution): Payer: 59 | Admitting: Internal Medicine

## 2016-03-17 ENCOUNTER — Encounter: Payer: Self-pay | Admitting: Cardiology

## 2016-03-17 ENCOUNTER — Ambulatory Visit (INDEPENDENT_AMBULATORY_CARE_PROVIDER_SITE_OTHER): Payer: 59 | Admitting: Cardiology

## 2016-03-17 ENCOUNTER — Encounter (INDEPENDENT_AMBULATORY_CARE_PROVIDER_SITE_OTHER): Payer: 59

## 2016-03-17 VITALS — BP 116/80 | HR 76 | Ht 66.0 in | Wt 187.2 lb

## 2016-03-17 DIAGNOSIS — R799 Abnormal finding of blood chemistry, unspecified: Secondary | ICD-10-CM | POA: Diagnosis not present

## 2016-03-17 DIAGNOSIS — R079 Chest pain, unspecified: Secondary | ICD-10-CM | POA: Diagnosis not present

## 2016-03-17 DIAGNOSIS — R7989 Other specified abnormal findings of blood chemistry: Secondary | ICD-10-CM

## 2016-03-17 DIAGNOSIS — R002 Palpitations: Secondary | ICD-10-CM | POA: Diagnosis not present

## 2016-03-17 DIAGNOSIS — R825 Elevated urine levels of drugs, medicaments and biological substances: Secondary | ICD-10-CM | POA: Diagnosis not present

## 2016-03-17 MED ORDER — PROPRANOLOL HCL 10 MG PO TABS: 10.0000 mg | ORAL_TABLET | ORAL | Status: DC | PRN

## 2016-03-17 NOTE — Patient Instructions (Signed)
Your physician wants you to follow-up in: After Monitor. You will receive a reminder letter in the mail two months in advance. If you don't receive a letter, please call our office to schedule the follow-up appointment.  Your physician has recommended that you wear an event monitor 21 days. Event monitors are medical devices that record the heart's electrical activity. Doctors most often us these monitors to diagnose arrhythmias. Arrhythmias are problems with the speed or rhythm of the heartbeat. The monitor is a small, portable device. You can wear one while you do your normal daily activities. This is usually used to diagnose what is causing palpitations/syncope (passing out).  Your physician recommends that you return for lab work in: 24 hour Urine  Your physician has recommended you make the following change in your medication: Propanolol 10 mg as needed

## 2016-03-17 NOTE — Progress Notes (Signed)
Cardiology Office Note   Date:  03/17/2016   ID:  Melissa Stanley, DOB Jul 11, 1991, MRN [id number]  PCP:  Lenise Herald, PA-C  Cardiologist:   Rollene Rotunda, MD   No chief complaint on file.     History of Present Illness: Melissa Stanley is a 25 y.o. female who presents for evaluation of palpitations. Since July of last year the patient has had episodes of chest tightness. Her heart rate control. She will feel very short of breath. His happening frequently 3-4 times per week. It happens sporadically but more at night at the end of a long day. She has seen her primary care physician and been treated with antidepressants and antianxiety medicines without resolution. She became very symptomatic on beta blockers. She says that when the events happened it doesn't seem to be a trigger. They may last for 15 minutes or longer. She will get very hypertensive. She'll get hot and uncomfortable. She gets chest discomfort but no neck or arm discomfort. She might get short of breath. She started to hyperventilate. She otherwise when these are not happening has no symptoms and can be very active. She does have a significant amount of stress. She's married with 2 young daughters. 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 She's had no prior cardiac workup or problems.  PMH:  None  Past Surgical History  Procedure Laterality Date  . Tonsillectomy    . Wisdom tooth extraction       Current Outpatient Prescriptions  Medication Sig Dispense Refill  . ALPRAZolam (XANAX) 0.25 MG tablet Take 0.25 mg by mouth daily as needed for anxiety.    . clonazePAM (KLONOPIN) 0.5 MG tablet Take 0.5 mg by mouth every 4 (four) hours. Per pt take 1 tab po qhs    . Lactobacillus (PROBIOTIC ACIDOPHILUS PO) Take 1 tablet by mouth daily.    . Multiple Vitamins-Minerals (MULTIVITAMIN & MINERAL PO) Take 1 tablet by mouth daily.    . NON FORMULARY Take by mouth daily. Spark (vitamin C  and amino acid energy powder) drinks 1 cup daily    . NON FORMULARY ThermaPlus (Breaks down carbs) take 1 tab by mouth daily    . propranolol (INDERAL) 10 MG tablet Take 1 tablet (10 mg total) by mouth as needed. 30 tablet 6   No current facility-administered medications for this visit.    Allergies:   Review of patient's allergies indicates no known allergies.    Social History:  The patient  reports that she has never smoked. She has never used smokeless tobacco. She reports that she drinks alcohol.   Family History:  The patient's family history includes Cancer in her mother; Cirrhosis in her maternal grandfather; Depression in her brother, father, and mother; Diabetes in her father and mother; Heart failure in her maternal grandmother and mother; Hypertension in her brother and mother.    ROS:  Please see the history of present illness.   Otherwise, review of systems are positive for none.   All other systems are reviewed and negative.    PHYSICAL EXAM: VS:  BP 116/80 mmHg  Pulse 76  Ht  (1.676 m)  Wt 187 lb 4 oz (84.936 kg)  BMI 30.24 kg/m2  LMP 02/21/2016 , BMI Body mass index is 30.24 kg/(m^2). GENERAL:  Well appearing HEENT:  Pupils equal round and reactive, fundi not visualized, oral mucosa unremarkable NECK:  No jugular venous distention, waveform within normal limits, carotid  upstroke brisk and symmetric, no bruits, no thyromegaly LYMPHATICS:  No cervical, inguinal adenopathy LUNGS:  Clear to auscultation bilaterally BACK:  No CVA tenderness CHEST:  Unremarkable HEART:  PMI not displaced or sustained,S1 and S2 within normal limits, no S3, no S4, no clicks, no rubs, no murmurs ABD:  Flat, positive bowel sounds normal in frequency in pitch, no bruits, no rebound, no guarding, no midline pulsatile mass, no hepatomegaly, no splenomegaly EXT:  2 plus pulses throughout, no edema, no cyanosis no clubbing SKIN:  No rashes no nodules NEURO:  Cranial nerves II through XII  grossly intact, motor grossly intact throughout PSYCH:  Cognitively intact, oriented to person place and time    EKG:  EKG is ordered today. The ekg ordered today demonstrates sinus rhythm, rate 77, axis within normal limits, intervals within normal limits, no acute ST-T wave changes.   Recent Labs: 06/29/2015: ALT 18; Magnesium 1.6*; Platelets 330 09/15/2015: BUN 10; Creatinine, Ser 0.70; Hemoglobin 16.3*; Potassium 3.8; Sodium 142    Lipid Panel No results found for: CHOL, TRIG, HDL, CHOLHDL, VLDL, LDLCALC, LDLDIRECT    Wt Readings from Last 3 Encounters:  03/17/16 187 lb 4 oz (84.936 kg)  09/15/15 172 lb (78.019 kg)  08/11/15 176 lb (79.833 kg)      Other studies Reviewed: Additional studies/ records that were reviewed today include: None. Review of the above records demonstrates:  Please see elsewhere in the note.     ASSESSMENT AND PLAN:  CHEST PAIN:  The pretest probability of obstructive coronary disease is very low. I don't think further stress testing is indicated. I suspect this is related to the palpitations as described below.  PALPITATIONS:  She will need an event monitor. Kaylah Ferger will need a 21 day event monitor.  The patients symptoms necessitate an event monitor.  The symptoms are too infrequent to be identified on a Holter monitor.   I will check 24-hour urine for VMA and metanephrines. If these are normal I think the most likely diagnosis would be panic. He has seen a psychologist but has had trouble finding a psychiatrist. We talked about this and she will pursue this and appropriate therapy while we were looking at other etiologies.   Current medicines are reviewed at length with the patient today.  The patient does not have concerns regarding medicines.  The following changes have been made:  no change  Labs/ tests ordered today include:   Orders Placed This Encounter  Procedures  . Metanephrines, Urine, 24 hour  . VMA, urine, 24 hour  .  Cardiac event monitor  . EKG 12-Lead     Disposition:   FU with me after the event monitor.      Signed, Rollene RotundaJames Zamantha Strebel, MD  03/17/2016 5:39 PM    [facility]

## 2016-03-19 DIAGNOSIS — R002 Palpitations: Secondary | ICD-10-CM | POA: Insufficient documentation

## 2016-04-06 DIAGNOSIS — R079 Chest pain, unspecified: Secondary | ICD-10-CM

## 2016-04-07 ENCOUNTER — Institutional Professional Consult (permissible substitution): Payer: 59 | Admitting: Internal Medicine

## 2016-04-10 ENCOUNTER — Other Ambulatory Visit: Payer: Self-pay | Admitting: *Deleted

## 2016-04-10 ENCOUNTER — Telehealth: Payer: Self-pay | Admitting: Cardiology

## 2016-04-10 DIAGNOSIS — E669 Obesity, unspecified: Secondary | ICD-10-CM | POA: Diagnosis not present

## 2016-04-10 DIAGNOSIS — Z1389 Encounter for screening for other disorder: Secondary | ICD-10-CM | POA: Diagnosis not present

## 2016-04-10 DIAGNOSIS — Z6829 Body mass index (BMI) 29.0-29.9, adult: Secondary | ICD-10-CM | POA: Diagnosis not present

## 2016-04-10 DIAGNOSIS — F410 Panic disorder [episodic paroxysmal anxiety] without agoraphobia: Secondary | ICD-10-CM | POA: Diagnosis not present

## 2016-04-10 DIAGNOSIS — R825 Elevated urine levels of drugs, medicaments and biological substances: Secondary | ICD-10-CM

## 2016-04-10 DIAGNOSIS — F411 Generalized anxiety disorder: Secondary | ICD-10-CM | POA: Diagnosis not present

## 2016-04-10 DIAGNOSIS — F329 Major depressive disorder, single episode, unspecified: Secondary | ICD-10-CM | POA: Diagnosis not present

## 2016-04-10 DIAGNOSIS — E663 Overweight: Secondary | ICD-10-CM | POA: Diagnosis not present

## 2016-04-10 NOTE — Telephone Encounter (Signed)
Pt called in wanting to know if she could go to the lab at [facility] to have the labs done since it is closer to her. Please f/u with her  Thanks

## 2016-04-10 NOTE — Telephone Encounter (Signed)
Called pt to inform labs can be done at [facility], if needing to get done elsewhere give us a call.

## 2016-04-11 MED FILL — busPIRone HCL 7.5 MG TABS: 7.5 | 30 days supply | Qty: 60 | Fill #0

## 2016-04-11 MED FILL — SERTRALINE HCL 25 MG TABLET: 25 | 30 days supply | Qty: 30 | Fill #0

## 2016-04-11 MED FILL — HYDROXYZINE PAM 25 MG CAP: 25 | 30 days supply | Qty: 60 | Fill #0

## 2016-04-13 NOTE — Progress Notes (Signed)
Cardiology Office Note   Date:  04/14/2016   ID:  Melissa Stanley, DOB 12-May-1991, MRN [id number]  PCP:  Lenise Herald, PA-C  Cardiologist:   Rollene Rotunda, MD   Chief Complaint  Patient presents with  . Shortness of Breath  . Dizziness  . Chest Pain      History of Present Illness: Melissa Stanley is a 25 y.o. female who presents for evaluation of palpitations.  I saw her previously and she wore an event monitor. I brought her back and we reviewed this today. She is having episodes of sinus tach artery. Seem to happen at work and she did trigger the monitor when she was having her episodes. She's currently having her medications changed. She's been taken off of benzodiazepines and is being uptitrated on antidepressants for probable anxiety. She still very anxious about these episodes. They are as described in the previous note. She was to get 24-hour urine for VMA and metanephrines but she tender other problems with this. She has yet to do this.  She has episodes of chest pain with her palpitations.  She feels very anxious with all of this.    PMH:  None  Past Surgical History  Procedure Laterality Date  . Tonsillectomy    . Wisdom tooth extraction       Current Outpatient Prescriptions  Medication Sig Dispense Refill  . ALPRAZolam (XANAX) 0.25 MG tablet Take 0.25 mg by mouth daily as needed for anxiety.    . busPIRone (BUSPAR) 7.5 MG tablet Take 7.5 mg by mouth 3 (three) times daily.    . hydrOXYzine (VISTARIL) 25 MG capsule Take 25 mg by mouth 3 (three) times daily as needed.    . Lactobacillus (PROBIOTIC ACIDOPHILUS PO) Take 1 tablet by mouth daily.    . Multiple Vitamins-Minerals (MULTIVITAMIN & MINERAL PO) Take 1 tablet by mouth daily.    . propranolol (INDERAL) 10 MG tablet Take 1 tablet (10 mg total) by mouth as needed. 30 tablet 6  . sertraline (ZOLOFT) 25 MG tablet Take 12.5 mg by mouth daily. Take 12.5 mg for 1 week them 25 mg on day 8.     No current  facility-administered medications for this visit.    Allergies:   Review of patient's allergies indicates no known allergies.    ROS:  Please see the history of present illness.   Otherwise, review of systems are positive for none.   All other systems are reviewed and negative.    PHYSICAL EXAM: VS:  BP 122/78 mmHg  Pulse 74  Ht  (1.676 m)  Wt 189 lb (85.73 kg)  BMI 30.52 kg/m2 , BMI Body mass index is 30.52 kg/(m^2). GENERAL:  Well appearing HEENT:  Pupils equal round and reactive, fundi not visualized, oral mucosa unremarkable NECK:  No jugular venous distention, waveform within normal limits, carotid upstroke brisk and symmetric, no bruits, no thyromegaly LUNGS:  Clear to auscultation bilaterally CHEST:  Unremarkable HEART:  PMI not displaced or sustained,S1 and S2 within normal limits, no S3, no S4, no clicks, no rubs, no murmurs ABD:  Flat, positive bowel sounds normal in frequency in pitch, no bruits, no rebound, no guarding, no midline pulsatile mass, no hepatomegaly, no splenomegaly EXT:  2 plus pulses throughout, no edema, no cyanosis no clubbing    EKG:  EKG is ordered today. The ekg ordered today demonstrates sinus rhythm, rate 74, axis within normal limits, intervals within normal limits, no acute ST-T wave changes.   Recent  Labs: 06/29/2015: ALT 18; Magnesium 1.6*; Platelets 330 09/15/2015: BUN 10; Creatinine, Ser 0.70; Hemoglobin 16.3*; Potassium 3.8; Sodium 142    Lipid Panel No results found for: CHOL, TRIG, HDL, CHOLHDL, VLDL, LDLCALC, LDLDIRECT    Wt Readings from Last 3 Encounters:  04/14/16 189 lb (85.73 kg)  03/17/16 187 lb 4 oz (84.936 kg)  09/15/15 172 lb (78.019 kg)      Other studies Reviewed: Additional studies/ records that were reviewed today include: None. Review of the above records demonstrates:  Please see elsewhere in the note.     ASSESSMENT AND PLAN:  CHEST PAIN:  The pretest probability of obstructive coronary disease is low.   I will bring the patient back for a POET (Plain Old Exercise Test). This will allow me to screen for obstructive coronary disease, risk stratify and very importantly provide a prescription for exercise.  PALPITATIONS:  She has Sinus tachycardia and I believe this is likely panic and I agree with plans for medical management.  In addition she can take PRN beta blocker and I will renew the prescription for Inderal.   She will also get the VMA which was supposed to be done at the last visit.     Current medicines are reviewed at length with the patient today.  The patient does not have concerns regarding medicines.  The following changes have been made:  no change  Labs/ tests ordered today include:     Orders Placed This Encounter  Procedures  . Exercise Tolerance Test  . EKG 12-Lead     Disposition:   FU with me in 4 months.    Signed, Rollene RotundaJames Radin Raptis, MD  04/14/2016 3:30 PM    [facility]

## 2016-04-14 ENCOUNTER — Ambulatory Visit (INDEPENDENT_AMBULATORY_CARE_PROVIDER_SITE_OTHER): Payer: 59 | Admitting: Cardiology

## 2016-04-14 ENCOUNTER — Encounter: Payer: Self-pay | Admitting: Cardiology

## 2016-04-14 VITALS — BP 122/78 | HR 74 | Ht 66.0 in | Wt 189.0 lb

## 2016-04-14 DIAGNOSIS — R000 Tachycardia, unspecified: Secondary | ICD-10-CM | POA: Diagnosis not present

## 2016-04-14 MED ORDER — PROPRANOLOL HCL 10 MG PO TABS: 10.0000 mg | ORAL_TABLET | ORAL | Status: DC | PRN

## 2016-04-14 MED FILL — PROPRANOLOL 10 MG TABLET: 10 | 30 days supply | Qty: 30 | Fill #0

## 2016-04-14 NOTE — Patient Instructions (Signed)
Medication Instructions:  Continue current medication  Labwork: NONE  Testing/Procedures: Your physician has requested that you have an exercise tolerance test. For further information please visit [email or web address]. Please also follow instruction sheet, as given.   Follow-Up: Your physician wants you to follow-up in: 4 Months. You will receive a reminder letter in the mail two months in advance. If you don't receive a letter, please call our office to schedule the follow-up appointment.   Any Other Special Instructions Will Be Listed Below (If Applicable).     If you need a refill on your cardiac medications before your next appointment, please call your pharmacy.

## 2016-04-24 DIAGNOSIS — E669 Obesity, unspecified: Secondary | ICD-10-CM | POA: Diagnosis not present

## 2016-04-24 DIAGNOSIS — E663 Overweight: Secondary | ICD-10-CM | POA: Diagnosis not present

## 2016-04-24 DIAGNOSIS — Z1389 Encounter for screening for other disorder: Secondary | ICD-10-CM | POA: Diagnosis not present

## 2016-04-24 DIAGNOSIS — F329 Major depressive disorder, single episode, unspecified: Secondary | ICD-10-CM | POA: Diagnosis not present

## 2016-04-24 DIAGNOSIS — F410 Panic disorder [episodic paroxysmal anxiety] without agoraphobia: Secondary | ICD-10-CM | POA: Diagnosis not present

## 2016-04-24 DIAGNOSIS — F411 Generalized anxiety disorder: Secondary | ICD-10-CM | POA: Diagnosis not present

## 2016-04-24 DIAGNOSIS — Z6828 Body mass index (BMI) 28.0-28.9, adult: Secondary | ICD-10-CM | POA: Diagnosis not present

## 2016-04-24 MED FILL — SERTRALINE HCL 50 MG TABLET: 50 | 30 days supply | Qty: 30 | Fill #0

## 2016-04-24 MED FILL — QUETIAPINE FUMARATE 50 MG T: 50 | 30 days supply | Qty: 90 | Fill #0

## 2016-04-24 MED FILL — busPIRone HCL 10 MG TABS: 10 | 30 days supply | Qty: 90 | Fill #0

## 2016-04-25 ENCOUNTER — Ambulatory Visit (INDEPENDENT_AMBULATORY_CARE_PROVIDER_SITE_OTHER): Payer: 59 | Admitting: Psychiatry

## 2016-04-25 ENCOUNTER — Encounter (HOSPITAL_COMMUNITY): Payer: Self-pay | Admitting: Psychiatry

## 2016-04-25 VITALS — BP 112/70 | HR 77 | Ht 66.0 in | Wt 186.8 lb

## 2016-04-25 DIAGNOSIS — F419 Anxiety disorder, unspecified: Secondary | ICD-10-CM

## 2016-04-25 DIAGNOSIS — F410 Panic disorder [episodic paroxysmal anxiety] without agoraphobia: Secondary | ICD-10-CM

## 2016-04-25 DIAGNOSIS — R825 Elevated urine levels of drugs, medicaments and biological substances: Secondary | ICD-10-CM | POA: Diagnosis not present

## 2016-04-25 DIAGNOSIS — F4310 Post-traumatic stress disorder, unspecified: Secondary | ICD-10-CM

## 2016-04-25 DIAGNOSIS — R799 Abnormal finding of blood chemistry, unspecified: Secondary | ICD-10-CM | POA: Diagnosis not present
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
## 2016-04-26 ENCOUNTER — Ambulatory Visit (HOSPITAL_COMMUNITY): Payer: Self-pay | Admitting: Psychiatry

## 2016-04-28 ENCOUNTER — Telehealth (HOSPITAL_COMMUNITY): Payer: Self-pay

## 2016-04-28 NOTE — Telephone Encounter (Signed)
Encounter complete. 

## 2016-04-29 LAB — METANEPHRINES, URINE, 24 HOUR
METANEPH TOTAL UR: 268 ug/(24.h) (ref 94–604)
Metanephrines, Ur: 111 mcg/24 h (ref 25–222)
NORMETANEPHRINE 24H UR: 157 ug/(24.h) (ref 40–412)

## 2016-05-01 ENCOUNTER — Telehealth: Payer: Self-pay | Admitting: Cardiology

## 2016-05-01 NOTE — Telephone Encounter (Signed)
Results discussed w patient after callback. She verbalized understanding of test results. Also per pt request, updated med list to reflect her most recent changes to regimen.

## 2016-05-01 NOTE — Telephone Encounter (Signed)
Detailed msg left at [phone] per [facility] instruction.

## 2016-05-01 NOTE — Telephone Encounter (Signed)
New Message  Pt called to discuss lab results

## 2016-05-03 ENCOUNTER — Ambulatory Visit (HOSPITAL_COMMUNITY)
Admission: RE | Admit: 2016-05-03 | Discharge: 2016-05-03 | Disposition: A | Discharge status: Home / Self Care | Payer: 59 | Source: Ambulatory Visit | Attending: Cardiovascular Disease | Admitting: Cardiovascular Disease

## 2016-05-03 DIAGNOSIS — R000 Tachycardia, unspecified: Secondary | ICD-10-CM | POA: Diagnosis not present

## 2016-05-03 LAB — EXERCISE TOLERANCE TEST
CSEPED: 9 min
CSEPEDS: 5 s
CSEPEW: 10.2 METS
CSEPPHR: 203 {beats}/min
MPHR: 193 {beats}/min
Percent HR: 105 %
RPE: 17
Rest HR: 96 {beats}/min

## 2016-05-03 LAB — VMA, URINE, 24 HOUR
Creatinine 24h urine: 1.54 g/(24.h) (ref 0.63–2.50)
Vanillylmandelic Acid, (VMA): 3.5 mg/24 h (ref <=6.0)

## 2016-05-04 ENCOUNTER — Telehealth: Payer: Self-pay | Admitting: *Deleted

## 2016-05-04 NOTE — Telephone Encounter (Signed)
spoke with pt about her result, pt is c/o about her blood pressure being high when she was doing the test and also about headache she been having lately, she want to know if you seen her BP numbers while she was doing her stress test and what you think about it being high.

## 2016-05-04 NOTE — Telephone Encounter (Signed)
That BP was appropriate for the level of exercise.

## 2016-05-04 NOTE — Telephone Encounter (Signed)
-----   Message from Rollene RotundaJames Hochrein, MD sent at 05/04/2016  8:01 AM EDT ----- No evidence of ischemia.  Call Ms. Franklyn with the results and send results to [facility], Sharlet SalinaBENJAMIN, PA-C

## 2016-05-05 NOTE — Telephone Encounter (Signed)
Spoke with pt

## 2016-05-09 ENCOUNTER — Encounter (HOSPITAL_COMMUNITY): Payer: Self-pay | Admitting: Psychiatry

## 2016-05-09 ENCOUNTER — Ambulatory Visit (INDEPENDENT_AMBULATORY_CARE_PROVIDER_SITE_OTHER): Payer: 59 | Admitting: Psychiatry

## 2016-05-09 VITALS — BP 120/82 | HR 84 | Ht 66.0 in | Wt 184.0 lb

## 2016-05-09 DIAGNOSIS — F419 Anxiety disorder, unspecified: Secondary | ICD-10-CM

## 2016-05-09 DIAGNOSIS — F410 Panic disorder [episodic paroxysmal anxiety] without agoraphobia: Secondary | ICD-10-CM

## 2016-05-09 DIAGNOSIS — Z79899 Other long term (current) drug therapy: Secondary | ICD-10-CM

## 2016-05-09 MED ORDER — SERTRALINE HCL 100 MG PO TABS: 100.0000 mg | ORAL_TABLET | Freq: Every day | ORAL | Status: DC

## 2016-05-09 MED FILL — SERTRALINE HCL 100 MG TAB: 100 | 30 days supply | Qty: 30 | Fill #0
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
## 2016-05-19 ENCOUNTER — Ambulatory Visit (HOSPITAL_COMMUNITY): Payer: Self-pay | Admitting: Psychiatry

## 2016-05-22 ENCOUNTER — Ambulatory Visit (HOSPITAL_COMMUNITY): Payer: Self-pay | Admitting: Psychiatry

## 2016-06-07 ENCOUNTER — Ambulatory Visit (INDEPENDENT_AMBULATORY_CARE_PROVIDER_SITE_OTHER): Payer: 59 | Admitting: Psychiatry

## 2016-06-07 ENCOUNTER — Telehealth (HOSPITAL_COMMUNITY): Payer: Self-pay | Admitting: *Deleted

## 2016-06-07 ENCOUNTER — Encounter (HOSPITAL_COMMUNITY): Payer: Self-pay | Admitting: Psychiatry

## 2016-06-07 DIAGNOSIS — F410 Panic disorder [episodic paroxysmal anxiety] without agoraphobia: Secondary | ICD-10-CM

## 2016-06-07 NOTE — Telephone Encounter (Signed)
patient said she will call back to reschedule her appointment with Peggy.

## 2016-06-07 NOTE — Patient Instructions (Signed)
Discussed orally 
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
## 2016-06-12 MED FILL — SERTRALINE HCL 100 MG TAB: 100 | 30 days supply | Qty: 30 | Fill #1

## 2016-07-03 ENCOUNTER — Telehealth (HOSPITAL_COMMUNITY): Payer: Self-pay

## 2016-07-03 NOTE — Telephone Encounter (Signed)
Patient is calling because at her last visit with Dr. Lolly Mustache they discussed her coming off of Seroquel. Patient states that she is not scheduled with him now until September and she would really like to come off of the Seroquel. Patient is prescribed 50 mg, but only takes 25mg . She has tried not taking it, but then she can not sleep - pt. And I discussed Melatonin, she had never tried it. I told her I would discuss with you and call her back. She also does not really want a sleeping pill like Ambien. Please review and advise, thank you

## 2016-07-04 NOTE — Telephone Encounter (Signed)
She can try Melatonin.More likely that patient will have trouble adjusting without Seroquel. It will improve but it may take a few weeks. I recommend she wait to discuss it with Dr. Lolly Mustache.

## 2016-07-04 NOTE — Telephone Encounter (Signed)
I called patient back and told her what Dr. Arville Lime recommendation was, patient did not answer, I left a voicemail for her to call me back.

## 2016-07-10 ENCOUNTER — Ambulatory Visit (HOSPITAL_COMMUNITY): Payer: Self-pay | Admitting: Psychiatry

## 2016-07-13 ENCOUNTER — Other Ambulatory Visit (HOSPITAL_COMMUNITY): Payer: Self-pay | Admitting: Psychiatry

## 2016-07-13 DIAGNOSIS — F410 Panic disorder [episodic paroxysmal anxiety] without agoraphobia: Secondary | ICD-10-CM

## 2016-07-13 MED FILL — SERTRALINE HCL 100 MG TAB: 100 | 30 days supply | Qty: 30 | Fill #0

## 2016-08-04 ENCOUNTER — Encounter: Payer: Self-pay | Admitting: Cardiology

## 2016-08-11 MED FILL — SERTRALINE HCL 100 MG TAB: 100 | 30 days supply | Qty: 30 | Fill #1

## 2016-08-11 MED FILL — ALPRAZolam 0.25 MG TABS: 0.25 | 30 days supply | Qty: 60 | Fill #0

## 2016-08-18 ENCOUNTER — Ambulatory Visit: Payer: 59 | Admitting: Cardiology

## 2016-08-30 ENCOUNTER — Ambulatory Visit (HOSPITAL_COMMUNITY): Payer: Self-pay | Admitting: Psychiatry

## 2016-09-05 ENCOUNTER — Ambulatory Visit (HOSPITAL_COMMUNITY): Payer: Self-pay | Admitting: Psychiatry

## 2016-09-12 ENCOUNTER — Other Ambulatory Visit (HOSPITAL_COMMUNITY): Payer: Self-pay | Admitting: Psychiatry

## 2016-09-12 DIAGNOSIS — F410 Panic disorder [episodic paroxysmal anxiety] without agoraphobia: Secondary | ICD-10-CM

## 2016-09-12 MED FILL — ALPRAZolam 0.25 MG TABS: 0.25 | 30 days supply | Qty: 60 | Fill #1

## 2016-09-12 MED FILL — QUETIAPINE FUMARATE 50 MG T: 50 | 30 days supply | Qty: 90 | Fill #1

## 2016-09-13 ENCOUNTER — Other Ambulatory Visit (HOSPITAL_COMMUNITY): Payer: Self-pay

## 2016-09-13 DIAGNOSIS — F410 Panic disorder [episodic paroxysmal anxiety] without agoraphobia: Secondary | ICD-10-CM

## 2016-09-13 MED ORDER — SERTRALINE HCL 100 MG PO TABS: 100.0000 mg | ORAL_TABLET | Freq: Every day | ORAL | 0 refills | Status: DC

## 2016-09-13 MED FILL — SERTRALINE HCL 100 MG TAB: 100 | 30 days supply | Qty: 30 | Fill #0

## 2016-10-12 ENCOUNTER — Other Ambulatory Visit (HOSPITAL_COMMUNITY): Payer: Self-pay

## 2016-10-12 DIAGNOSIS — F410 Panic disorder [episodic paroxysmal anxiety] without agoraphobia: Secondary | ICD-10-CM

## 2016-10-12 MED ORDER — SERTRALINE HCL 100 MG PO TABS: 100.0000 mg | ORAL_TABLET | Freq: Every day | ORAL | 1 refills | Status: DC

## 2016-10-12 MED FILL — SERTRALINE HCL 100 MG TAB: 100 | 30 days supply | Qty: 30 | Fill #0

## 2016-11-13 MED FILL — SERTRALINE HCL 100 MG TAB: 100 | 30 days supply | Qty: 30 | Fill #1

## 2016-11-17 ENCOUNTER — Ambulatory Visit (HOSPITAL_COMMUNITY): Payer: Self-pay | Admitting: Psychiatry

## 2016-11-21 ENCOUNTER — Ambulatory Visit (HOSPITAL_COMMUNITY): Payer: Self-pay | Admitting: Psychiatry

## 2016-12-12 ENCOUNTER — Other Ambulatory Visit (HOSPITAL_COMMUNITY): Payer: Self-pay | Admitting: Psychiatry

## 2016-12-12 DIAGNOSIS — F410 Panic disorder [episodic paroxysmal anxiety] without agoraphobia: Secondary | ICD-10-CM

## 2016-12-13 ENCOUNTER — Other Ambulatory Visit (HOSPITAL_COMMUNITY): Payer: Self-pay

## 2016-12-13 DIAGNOSIS — F410 Panic disorder [episodic paroxysmal anxiety] without agoraphobia: Secondary | ICD-10-CM

## 2016-12-13 MED ORDER — SERTRALINE HCL 100 MG PO TABS: 100.0000 mg | ORAL_TABLET | Freq: Every day | ORAL | 0 refills | Status: DC

## 2016-12-13 MED FILL — SERTRALINE HCL 100 MG TAB: 100 | 30 days supply | Qty: 30 | Fill #0

## 2016-12-14 ENCOUNTER — Ambulatory Visit (HOSPITAL_COMMUNITY): Payer: Self-pay | Admitting: Psychiatry

## 2016-12-22 ENCOUNTER — Ambulatory Visit (INDEPENDENT_AMBULATORY_CARE_PROVIDER_SITE_OTHER): Payer: 59 | Admitting: Psychiatry

## 2016-12-22 ENCOUNTER — Encounter (HOSPITAL_COMMUNITY): Payer: Self-pay | Admitting: Psychiatry

## 2016-12-22 VITALS — BP 130/80 | HR 78 | Ht 67.0 in | Wt 194.0 lb

## 2016-12-22 DIAGNOSIS — F410 Panic disorder [episodic paroxysmal anxiety] without agoraphobia: Secondary | ICD-10-CM

## 2016-12-22 DIAGNOSIS — Z8249 Family history of ischemic heart disease and other diseases of the circulatory system: Secondary | ICD-10-CM | POA: Diagnosis not present

## 2016-12-22 DIAGNOSIS — Z833 Family history of diabetes mellitus: Secondary | ICD-10-CM | POA: Diagnosis not present

## 2016-12-22 DIAGNOSIS — Z811 Family history of alcohol abuse and dependence: Secondary | ICD-10-CM

## 2016-12-22 DIAGNOSIS — Z818 Family history of other mental and behavioral disorders: Secondary | ICD-10-CM

## 2016-12-22 DIAGNOSIS — Z808 Family history of malignant neoplasm of other organs or systems: Secondary | ICD-10-CM | POA: Diagnosis not present

## 2016-12-22 DIAGNOSIS — Z813 Family history of other psychoactive substance abuse and dependence: Secondary | ICD-10-CM

## 2016-12-22 DIAGNOSIS — Z79899 Other long term (current) drug therapy: Secondary | ICD-10-CM

## 2016-12-22 MED ORDER — QUETIAPINE FUMARATE 25 MG PO TABS: 25.0000 mg | ORAL_TABLET | Freq: Every day | ORAL | 1 refills | Status: AC

## 2016-12-22 MED ORDER — SERTRALINE HCL 100 MG PO TABS: 150.0000 mg | ORAL_TABLET | Freq: Every day | ORAL | 2 refills | Status: AC

## 2016-12-22 NOTE — Progress Notes (Signed)
BH MD/PA/NP OP Progress Note  12/22/2016 10:31 AM Melissa Stanley  MRN:  [id number]  Chief Complaint:  Subjective:  I'm doing better by still have anxiety and nervousness.  I'm taking semester off from school because I don't want to get overwhelmed.  HPI: Melissa Stanley came for her follow-up appointment.  She continues to take Seroquel 25 mg at bedtime as she is scared to stop because she is afraid of insomnia.  She never tried melatonin.  She is taking Zoloft 100 mg every day.  She denies any major panic attack or nervousness but she still feels sometimes anxious nervous and overwhelmed.  She regrets taking semester off from school.  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  She denies any feeling of hopelessness or worthlessness.  Her appetite is okay.  She denies any paranoia or any hallucination.  She continues to work at [employer] she like her job.  Her energy level is good.  She lives with her husband who is very supportive.  She has daughter who are 387 and 28 year old.  Her mother-in-law lives close by and very supportive.  She admitted gained weight during the holidays but she is aware about it and liked to watch her calorie intake.  Patient denies drinking alcohol or using any illegal substances.  Visit Diagnosis:    ICD-9-CM ICD-10-CM   1. 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  Past Medical History:  Past Medical History:  Diagnosis Date  . Anxiety   . Depression     Past Surgical History:  Procedure Laterality Date  . TONSILLECTOMY    . WISDOM TOOTH EXTRACTION      Family Psychiatric History: Reviewed.  Family History:  Family History  Problem Relation Age of Onset  . Cancer Mother   . Heart failure Mother   . Diabetes Mother   . Depression Mother   . Hypertension Mother   . Alcohol abuse Mother   . Drug abuse Mother   .  Diabetes Father   . Depression Father   . Drug abuse Father   . Heart failure Maternal Grandmother   . Cirrhosis Maternal Grandfather   . Depression Brother   . Hypertension Brother     Social History:  Social History   Social History  . Marital status: Married    Spouse name: N/A  . Number of children: 2  . Years of education: N/A   Social History Main Topics  . Smoking status: Never Smoker  . Smokeless tobacco: Never Used  . Alcohol use No  . Drug use: No  . Sexual activity: Yes    Birth control/ protection: None   Other Topics Concern  . None   Social History Narrative   Lives with husband and two children.  Works     Allergies:  Allergies  Allergen Reactions  . Vistaril [Hydroxyzine Hcl]     Adverse reaction - blood pressure dropped, shakes, clammy, sweaty    Metabolic Disorder Labs: No results found for: HGBA1C, MPG No results found for: PROLACTIN No results found for: CHOL, TRIG, HDL, CHOLHDL, VLDL, LDLCALC   Current Medications: Current Outpatient Prescriptions  Medication Sig Dispense Refill  . ALPRAZolam (XANAX) 0.25 MG tablet Take 0.25 mg by mouth 2 (two) times daily as needed.  2  . Lactobacillus (PROBIOTIC ACIDOPHILUS PO) Take 1 tablet by mouth daily. Reported  on 06/07/2016    . Multiple Vitamins-Minerals (MULTIVITAMIN & MINERAL PO) Take 1 tablet by mouth daily. Reported on 04/25/2016    . QUEtiapine (SEROQUEL) 25 MG tablet Take 50 mg by mouth at bedtime.     . sertraline (ZOLOFT) 100 MG tablet Take 1 tablet (100 mg total) by mouth daily. 30 tablet 0   No current facility-administered medications for this visit.     Neurologic: Headache: No Seizure: No Paresthesias: No  Musculoskeletal: Strength & Muscle Tone: within normal limits Gait & Station: normal Patient leans: N/A  Psychiatric Specialty Exam: ROS  Blood pressure 130/80, pulse 78, height 5\' 7"  (1.702 m), weight 194 lb (88 kg).Body mass index is 30.38 kg/m.  General Appearance:  Casual  Eye Contact:  Good  Speech:  Clear and Coherent  Volume:  Normal  Mood:  Euthymic  Affect:  Appropriate  Thought Process:  Goal Directed  Orientation:  Full (Time, Place, and Person)  Thought Content: WDL and Logical   Suicidal Thoughts:  No  Homicidal Thoughts:  No  Memory:  Immediate;   Good Recent;   Good Remote;   Good  Judgement:  Good  Insight:  Good  Psychomotor Activity:  Normal  Concentration:  Concentration: Good and Attention Span: Good  Recall:  Good  Fund of Knowledge: Good  Language: Good  Akathisia:  No  Handed:  Right  AIMS (if indicated):  0  Assets:  Communication Skills Desire for Improvement Housing Physical Health Resilience Social Support  ADL's:  Intact  Cognition: WNL  Sleep:  sleep   Assessment: Panic disorder without agoraphobia.  Anxiety disorder NOS.  Major depressive disorder recurrent mild.  Plan: I review psychosocial stressors in current medication.  She still feels anxious and nervous but denies any major panic attack.  I recommended to increase Zoloft 150 mg daily.  She is tolerating medicine without any side effects.  Also encourage to stop Seroquel due to weight gain and to take melatonin up to 9 mg if needed for insomnia.  Discussed melatonin benefits.  I also encouraged to resume school since she feels regret taking semester off.  I discuss if she needed any documentation from [place] reenrolled in school I will happy to provided.  Recommended to call us back if she has any question, concern if she feels worsening of the symptom.  Follow-up in 3 months.  Encourage to watch her calorie intake and to regular exercise.   Nishanth Mccaughan T., MD 12/22/2016, 10:31 AM

## 2017-01-08 MED FILL — SERTRALINE HCL 100 MG TAB: 100 | 30 days supply | Qty: 45 | Fill #0

## 2017-02-21 ENCOUNTER — Emergency Department (HOSPITAL_COMMUNITY): Payer: 59

## 2017-02-21 ENCOUNTER — Emergency Department (HOSPITAL_COMMUNITY)
Admission: EM | Admit: 2017-02-21 | Discharge: 2017-02-21 | Disposition: A | Discharge status: Home / Self Care | Payer: 59 | Attending: Emergency Medicine | Admitting: Emergency Medicine

## 2017-02-21 ENCOUNTER — Encounter (HOSPITAL_COMMUNITY): Payer: Self-pay | Admitting: Emergency Medicine

## 2017-02-21 DIAGNOSIS — R0789 Other chest pain: Secondary | ICD-10-CM | POA: Insufficient documentation

## 2017-02-21 DIAGNOSIS — R071 Chest pain on breathing: Secondary | ICD-10-CM | POA: Diagnosis present

## 2017-02-21 DIAGNOSIS — R911 Solitary pulmonary nodule: Secondary | ICD-10-CM | POA: Diagnosis not present

## 2017-02-21 DIAGNOSIS — R0781 Pleurodynia: Secondary | ICD-10-CM | POA: Diagnosis not present

## 2017-02-21 DIAGNOSIS — R079 Chest pain, unspecified: Secondary | ICD-10-CM | POA: Diagnosis not present

## 2017-02-21 LAB — CBC
HCT: 41.6 % (ref 36.0–46.0)
Hemoglobin: 13.7 g/dL (ref 12.0–15.0)
MCH: 29.6 pg (ref 26.0–34.0)
MCHC: 32.9 g/dL (ref 30.0–36.0)
MCV: 89.8 fL (ref 78.0–100.0)
PLATELETS: 375 10*3/uL (ref 150–400)
RBC: 4.63 MIL/uL (ref 3.87–5.11)
RDW: 13.3 % (ref 11.5–15.5)
WBC: 9.7 10*3/uL (ref 4.0–10.5)

## 2017-02-21 LAB — BASIC METABOLIC PANEL
Anion gap: 11 (ref 5–15)
BUN: 9 mg/dL (ref 6–20)
CALCIUM: 9.2 mg/dL (ref 8.9–10.3)
CHLORIDE: 102 mmol/L (ref 101–111)
CO2: 25 mmol/L (ref 22–32)
CREATININE: 0.8 mg/dL (ref 0.44–1.00)
GFR calc Af Amer: >60 mL/min (ref >60)
GFR calc non Af Amer: >60 mL/min (ref >60)
GLUCOSE: 82 mg/dL (ref 65–99)
Potassium: 3.9 mmol/L (ref 3.5–5.1)
Sodium: 138 mmol/L (ref 135–145)

## 2017-02-21 LAB — D-DIMER, QUANTITATIVE: D-Dimer, Quant: 0.54 ug/mL-FEU — ABNORMAL HIGH (ref 0.00–0.50)

## 2017-02-21 LAB — I-STAT TROPONIN, ED: TROPONIN I, POC: 0 ng/mL (ref 0.00–0.08)

## 2017-02-21 MED ORDER — DEXAMETHASONE SODIUM PHOSPHATE 10 MG/ML IJ SOLN
10.0000 mg | Freq: Once | INTRAMUSCULAR | Status: AC
  Administered 2017-02-21: 10 mg via INTRAVENOUS
  Filled 2017-02-21: qty 1

## 2017-02-21 MED ORDER — IBUPROFEN 800 MG PO TABS
800.0000 mg | ORAL_TABLET | Freq: Once | ORAL | Status: AC
  Administered 2017-02-21: 800 mg via ORAL
  Filled 2017-02-21: qty 1

## 2017-02-21 MED ORDER — IOPAMIDOL (ISOVUE-370) INJECTION 76%
INTRAVENOUS | Status: AC
  Administered 2017-02-21: 100 mL
  Filled 2017-02-21: qty 100

## 2017-02-21 MED ORDER — IBUPROFEN 800 MG PO TABS: 800.0000 mg | ORAL_TABLET | Freq: Three times a day (TID) | ORAL | 0 refills | Status: AC

## 2017-02-21 NOTE — ED Triage Notes (Signed)
Pt reports being at work and had a sudden onset of left side rib pain that goes into her chest and down her left arm. Pt states is painful to take a deep breath.

## 2017-02-21 NOTE — ED Provider Notes (Signed)
[facility]-EMERGENCY DEPT Provider Note   CSN: [id number] Arrival date & time: 02/21/17  1352     History   Chief Complaint Chief Complaint  Patient presents with  . Chest Pain    HPI Melissa Stanley is a 26 y.o. female.  She had onset this morning of sharp left-sided chest pain which radiates up into her left shoulder and upper arm. Pain is worse with taking a deep breath. She rates it at 3/7 at rest, and 7/10 when she takes a breath. There is no associated dyspnea, cough, fever, chills, nausea, vomiting. She does relate that she had some stomach upset over the last several days. She is a nonsmoker and denies history of hypertension, diabetes, hyperlipidemia. There is no family history of premature coronary artery disease. She denies history of DVT, recent travel, recent surgery, use of exogenous estrogens. She took ibuprofen this morning with no relief.   The history is provided by the patient.    Past Medical History:  Diagnosis Date  . Anxiety   . Depression     Patient Active Problem List   Diagnosis Date Noted  . Palpitations 03/19/2016    Past Surgical History:  Procedure Laterality Date  . TONSILLECTOMY    . WISDOM TOOTH EXTRACTION      OB History    No data available       Home Medications    Prior to Admission medications   Medication Sig Start Date End Date Taking? Authorizing Provider  ALPRAZolam (XANAX) 0.25 MG tablet Take 0.25 mg by mouth 2 (two) times daily as needed. 03/13/16   Historical Provider, MD  Lactobacillus (PROBIOTIC ACIDOPHILUS PO) Take 1 tablet by mouth daily. Reported on 06/07/2016    Historical Provider, MD  Multiple Vitamins-Minerals (MULTIVITAMIN & MINERAL PO) Take 1 tablet by mouth daily. Reported on 04/25/2016    Historical Provider, MD  QUEtiapine (SEROQUEL) 25 MG tablet Take 1 tablet (25 mg total) by mouth at bedtime. 12/22/16   Cleotis Nipper, MD  sertraline (ZOLOFT) 100 MG tablet Take 1.5 tablets (150 mg total) by mouth daily. 12/22/16    Cleotis Nipper, MD    Family History Family History  Problem Relation Age of Onset  . Cancer Mother   . Heart failure Mother   . Diabetes Mother   . Depression Mother   . Hypertension Mother   . Alcohol abuse Mother   . Drug abuse Mother   . Diabetes Father   . Depression Father   . Drug abuse Father   . Heart failure Maternal Grandmother   . Cirrhosis Maternal Grandfather   . Depression Brother   . Hypertension Brother     Social History Social History  Substance Use Topics  . Smoking status: Never Smoker  . Smokeless tobacco: Never Used  . Alcohol use No     Allergies   Vistaril [hydroxyzine hcl]   Review of Systems Review of Systems  All other systems reviewed and are negative.    Physical Exam Updated Vital Signs BP 138/83 (BP Location: Left Arm)   Pulse 93   Temp 98.6 F (37 C) (Oral)   Resp 17   Ht 5\' 7"  (1.702 m)   Wt 183 lb (83 kg)   LMP 02/14/2017   SpO2 100%   BMI 28.66 kg/m   Physical Exam  Nursing note and vitals reviewed.  26 year old female, resting comfortably and in no acute distress. Vital signs are normal. Oxygen saturation is 100%, which is normal.  Head is normocephalic and atraumatic. PERRLA, EOMI. Oropharynx is clear. Neck is nontender and supple without adenopathy or JVD. Back is nontender and there is no CVA tenderness. Lungs are clear without rales, wheezes, or rhonchi. Chest is nontender. Heart has regular rate and rhythm without murmur. Abdomen is soft, flat, nontender without masses or hepatosplenomegaly and peristalsis is normoactive. Extremities have no cyanosis or edema, full range of motion is present. Skin is warm and dry without rash. Neurologic: Mental status is normal, cranial nerves are intact, there are no motor or sensory deficits.  ED Treatments / Results  Labs (all labs ordered are listed, but only abnormal results are displayed) Labs Reviewed  D-DIMER, QUANTITATIVE (NOT AT [facility]) - Abnormal; Notable for  the following:       Result Value   D-Dimer, Quant 0.54 (*)    All other components within normal limits  BASIC METABOLIC PANEL  CBC  I-STAT TROPOININ, ED    EKG  EKG Interpretation  Date/Time:  Wednesday February 21 2017 13:57:00 EDT Ventricular Rate:  95 PR Interval:  126 QRS Duration: 76 QT Interval:  356 QTC Calculation: 447 R Axis:   81 Text Interpretation:  Normal sinus rhythm Normal ECG When compared with ECG of 09/15/2015, No significant change was found Confirmed by [facility]  MD, Joon Pohle ([id number]) on 02/21/2017 3:51:26 PM       Radiology Dg Chest 2 View  Result Date: 02/21/2017 CLINICAL DATA:  Left chest and flank pain EXAM: CHEST  2 VIEW COMPARISON:  Chest radiograph June 29, 2015 and chest CT February 21, 2016 FINDINGS: Lungs are clear. Heart size and pulmonary vascularity are normal. No adenopathy. No pneumothorax. No bone lesions. IMPRESSION: No edema or consolidation. Electronically Signed   By: Bretta Bang III M.D.   On: 02/21/2017 14:27   Ct Angio Chest Pe W And/or Wo Contrast  Result Date: 02/21/2017 CLINICAL DATA:  Pleuritic chest pain on the left side for 1 day EXAM: CT ANGIOGRAPHY CHEST WITH CONTRAST TECHNIQUE: Multidetector CT imaging of the chest was performed using the standard protocol during bolus administration of intravenous contrast. Multiplanar CT image reconstructions and MIPs were obtained to evaluate the vascular anatomy. CONTRAST:  100 cc Isovue 370 IV COMPARISON:  Chest CT 02/21/2016 FINDINGS: Cardiovascular: No filling defects in the pulmonary arteries to suggest pulmonary emboli. Insert Heart Mediastinum/Nodes: No mediastinal, hilar, or axillary adenopathy. Lungs/Pleura: Small triangular subpleural nodule in the right upper lobe is stable since prior study and likely reflects intrapulmonary lymph node. Small 3 mm subpleural nodule in the right upper lobe on image 38 is stable. No new or enlarging pulmonary nodules. No confluent opacities or effusions. Upper  Abdomen: Imaging into the upper abdomen shows no acute findings. Musculoskeletal: Chest wall soft tissues are unremarkable. No acute bony abnormality. Review of the MIP images confirms the above findings. IMPRESSION: No evidence of pulmonary embolus. Stable small right upper lobe nodules, at least 1 of which is likely an intrapulmonary lymph node. If the patient is high risk, these could be followed with repeat CT in 1 year to ensure 2 year stability. Electronically Signed   By: Charlett Nose M.D.   On: 02/21/2017 18:45    Procedures Procedures (including critical care time)  Medications Ordered in ED Medications  dexamethasone (DECADRON) injection 10 mg (not administered)  ibuprofen (ADVIL,MOTRIN) tablet 800 mg (800 mg Oral Given 02/21/17 1634)  iopamidol (ISOVUE-370) 76 % injection (100 mLs  Contrast Given 02/21/17 1825)     Initial Impression /  Assessment and Plan / ED Course  I have reviewed the triage vital signs and the nursing notes.  Pertinent labs & imaging results that were available during my care of the patient were reviewed by me and considered in my medical decision making (see chart for details).  Pleuritic chest pain-suspect viral pleurisy. ED workup is unremarkable including normal chest x-ray, ECG, troponin, CBC. Patient had discussed her symptoms with a medical service and was told to come in to evaluate for possible pulmonary embolus in. Will check d-dimer, although patient is PERC negative.  D-dimer is mildly elevated. She is sent for CT angiogram of chest which shows no evidence of pulmonary embolism, but does show 2 right-sided lung nodules. These are stable. Patient is a nonsmoker and not at high risk for lung cancer, so no further all up is necessary for these. Findings have been explained to the patient. She is given a dose of dexamethasone. She is taking over-the-counter ibuprofen. For ease of administration, she is given prescription for ibuprofen 800 mg to take 3 times  a day for 10 days. Follow-up with PCP.  Final Clinical Impressions(s) / ED Diagnoses   Final diagnoses:  Pleuritic chest pain  Nodule of right lung    New Prescriptions New Prescriptions   IBUPROFEN (ADVIL,MOTRIN) 800 MG TABLET    Take 1 tablet (800 mg total) by mouth 3 (three) times daily.     Dione Boozeavid Yaslin Kirtley, MD 02/21/17 [phone]

## 2017-02-22 MED FILL — IBUPROFEN 800 MG TABLET: 800 | 10 days supply | Qty: 30 | Fill #0

## 2017-02-22 MED FILL — SERTRALINE HCL 100 MG TAB: 100 | 30 days supply | Qty: 45 | Fill #1

## 2017-03-22 ENCOUNTER — Ambulatory Visit (HOSPITAL_COMMUNITY): Payer: Self-pay | Admitting: Psychiatry

## 2017-03-27 MED FILL — SERTRALINE HCL 100 MG TAB: 100 | 30 days supply | Qty: 45 | Fill #2

## 2017-04-04 DIAGNOSIS — L700 Acne vulgaris: Secondary | ICD-10-CM | POA: Diagnosis not present

## 2017-04-04 DIAGNOSIS — E6609 Other obesity due to excess calories: Secondary | ICD-10-CM | POA: Diagnosis not present

## 2017-04-04 DIAGNOSIS — Z6830 Body mass index (BMI) 30.0-30.9, adult: Secondary | ICD-10-CM | POA: Diagnosis not present

## 2017-04-04 DIAGNOSIS — L308 Other specified dermatitis: Secondary | ICD-10-CM | POA: Diagnosis not present

## 2017-04-04 DIAGNOSIS — R634 Abnormal weight loss: Secondary | ICD-10-CM | POA: Diagnosis not present

## 2017-04-04 DIAGNOSIS — F329 Major depressive disorder, single episode, unspecified: Secondary | ICD-10-CM | POA: Diagnosis not present

## 2017-04-04 DIAGNOSIS — E669 Obesity, unspecified: Secondary | ICD-10-CM | POA: Diagnosis not present

## 2017-04-04 DIAGNOSIS — Z1389 Encounter for screening for other disorder: Secondary | ICD-10-CM | POA: Diagnosis not present

## 2017-04-09 MED FILL — PHENTERMINE 15 MG CAPSULE: 15 | 30 days supply | Qty: 30 | Fill #0

## 2017-05-09 IMAGING — CT CT ANGIO CHEST
2 of 7 series · 6 of 37 positions shown · IV contrast (Omnipaque 300)
Comparison: 06/29/2015

CLINICAL DATA: Chest pain, shortness of breath, tachycardia

EXAM:
CT ANGIOGRAPHY CHEST WITH CONTRAST
TECHNIQUE: Multidetector CT imaging of the chest was performed using the
standard protocol during bolus administration of intravenous
contrast. Multiplanar CT image reconstructions and MIPs were
obtained to evaluate the vascular anatomy.
CONTRAST:  100mL OMNIPAQUE IOHEXOL 350 MG/ML SOLN

[Series 4: pe 3.0 b40f · axial · 0.61mm/px · z∈[-235,-64]mm · 5 of 87 slices shown]
[im 15/87  lung]
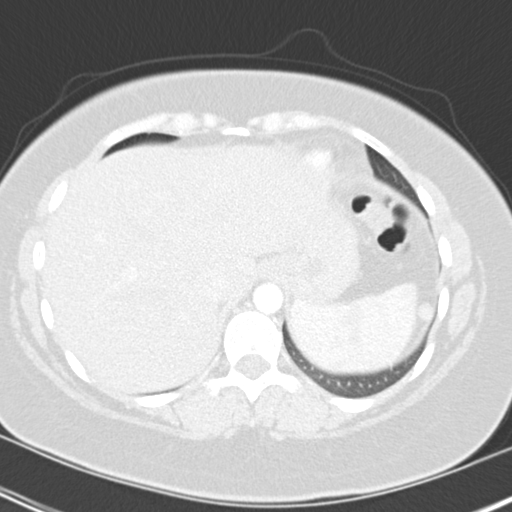
[im 29/87  mediastinal]
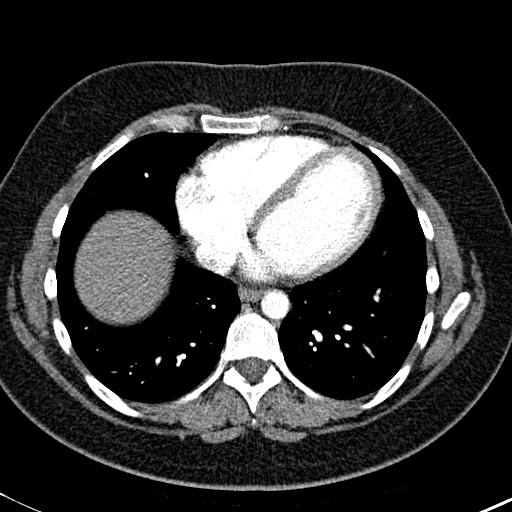
[im 44/87  lung]
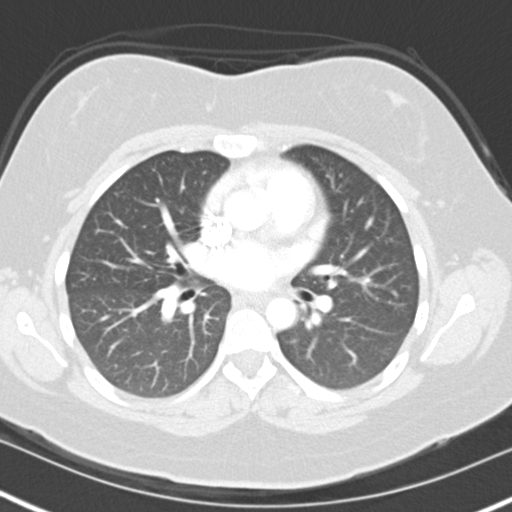
[im 58/87  mediastinal]
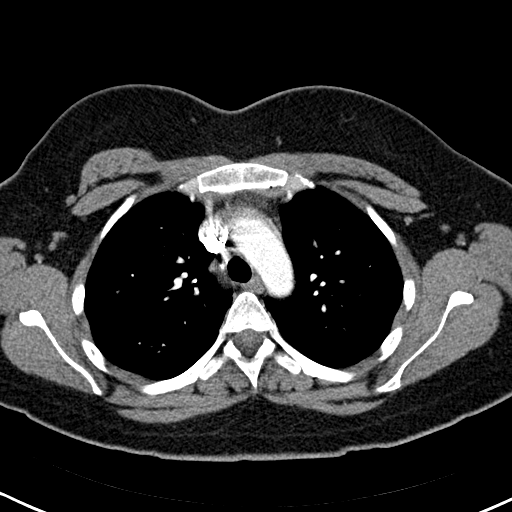
[im 72/87  lung]
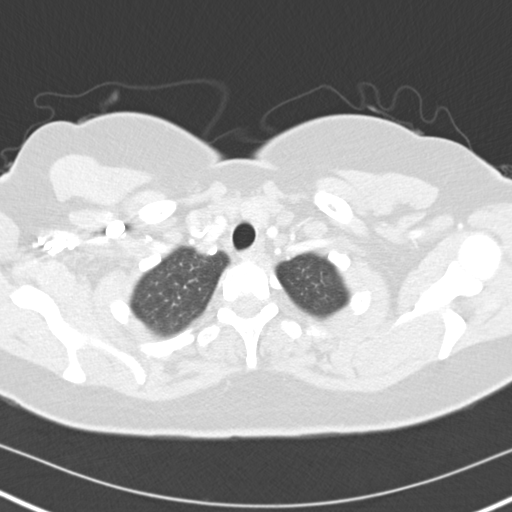

[Series 6: mpr coronal pe 3mm · coronal · 0.53mm/px · 1 of 85 slices shown]
[im 43/85  mediastinal]
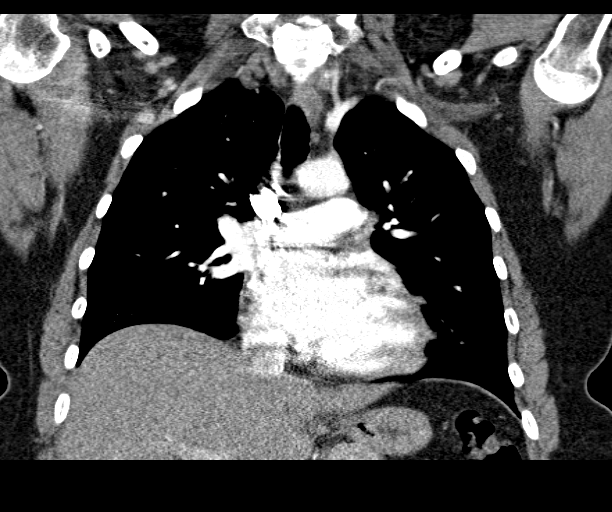

[6 of 37 positions shown; findings below may reference images not displayed]

FINDINGS: Mediastinum/Nodes: No significant hilar or mediastinal adenopathy.

Thoracic inlet is normal. Thoracic aorta shows no dissection or
dilatation.

No pericardial effusion. No filling defects in the pulmonary
arterial system.

Lungs/Pleura: No pleural effusion. No evidence of infiltrate or
consolidation. There are 2 pulmonary nodules in the right middle
lobe. The more cephalad of the 2 measures 5 mm. The more inferior
measures 4 mm. Both are located anteriorly. The left lung is clear.

Upper abdomen: A small splenule is noted.

Musculoskeletal: No acute musculoskeletal findings.

Review of the MIP images confirms the above findings.
IMPRESSION: No acute abnormalities. Incidentally detected pulmonary nodules on
the right. If the patient is at high risk for bronchogenic
carcinoma, follow-up chest CT at 6-12 months is recommended. If the
patient is at low risk for bronchogenic carcinoma, follow-up chest
CT at 12 months is recommended. This recommendation follows the
consensus statement: Guidelines for Management of Small Pulmonary
Nodules Detected on CT Scans: A Statement from the [employer]

These results will be called to the ordering clinician or
representative by the [HOSPITAL] at the imaging location.

## 2017-05-10 MED FILL — PHENTERMINE 15 MG CAPSULE: 15 | 30 days supply | Qty: 30 | Fill #1

## 2017-05-17 MED FILL — SERTRALINE HCL 100 MG TAB: 100 | 30 days supply | Qty: 30 | Fill #0

## 2017-05-29 DIAGNOSIS — H52223 Regular astigmatism, bilateral: Secondary | ICD-10-CM | POA: Diagnosis not present

## 2017-05-29 DIAGNOSIS — H5213 Myopia, bilateral: Secondary | ICD-10-CM | POA: Diagnosis not present

## 2017-05-30 DIAGNOSIS — F419 Anxiety disorder, unspecified: Secondary | ICD-10-CM | POA: Diagnosis not present

## 2017-05-30 DIAGNOSIS — Z0001 Encounter for general adult medical examination with abnormal findings: Secondary | ICD-10-CM | POA: Diagnosis not present

## 2017-05-30 DIAGNOSIS — Z1389 Encounter for screening for other disorder: Secondary | ICD-10-CM | POA: Diagnosis not present

## 2017-05-30 DIAGNOSIS — Z6829 Body mass index (BMI) 29.0-29.9, adult: Secondary | ICD-10-CM | POA: Diagnosis not present

## 2017-05-30 DIAGNOSIS — F329 Major depressive disorder, single episode, unspecified: Secondary | ICD-10-CM | POA: Diagnosis not present

## 2017-06-01 MED FILL — PHENTERMINE 37.5 MG TABLET: 37.5 | 30 days supply | Qty: 30 | Fill #0

## 2017-06-27 MED FILL — SERTRALINE HCL 100 MG TAB: 100 | 30 days supply | Qty: 30 | Fill #1

## 2017-07-09 DIAGNOSIS — L304 Erythema intertrigo: Secondary | ICD-10-CM | POA: Diagnosis not present

## 2017-07-09 DIAGNOSIS — R002 Palpitations: Secondary | ICD-10-CM | POA: Diagnosis not present

## 2017-07-09 DIAGNOSIS — R000 Tachycardia, unspecified: Secondary | ICD-10-CM | POA: Diagnosis not present

## 2017-07-09 DIAGNOSIS — Z6829 Body mass index (BMI) 29.0-29.9, adult: Secondary | ICD-10-CM | POA: Diagnosis not present

## 2017-07-16 MED FILL — CLOTRIMAZOLE-BETAMETHASONE: 1-0.05 | 30 days supply | Qty: 15 | Fill #0 | Status: TO

## 2017-07-16 MED FILL — PHENTERMINE 37.5 MG TABLET: 37.5 | 30 days supply | Qty: 30 | Fill #1

## 2017-07-16 MED FILL — PROPRANOLOL 10 MG TABLET: 10 | 30 days supply | Qty: 30 | Fill #0 | Status: TO

## 2017-07-24 MED FILL — SERTRALINE HCL 100 MG TAB: 100 | 30 days supply | Qty: 30 | Fill #2

## 2017-08-28 MED FILL — SERTRALINE HCL 100 MG TAB: 100 | 30 days supply | Qty: 30 | Fill #3 | Status: TO

## 2017-09-03 MED FILL — PHENTERMINE 37.5 MG TABLET: 37.5 | 30 days supply | Qty: 30 | Fill #0 | Status: TO
